# Patient Record
Sex: Male | Born: 1955 | Race: White | Hispanic: No | Marital: Married | Smoking: Never smoker
Health system: Southern US, Community
[De-identification: ages and names within clinical notes are randomized; demographics above are authoritative.]

## PROBLEM LIST (undated history)

## (undated) DIAGNOSIS — E119 Type 2 diabetes mellitus without complications: Secondary | ICD-10-CM

## (undated) DIAGNOSIS — I499 Cardiac arrhythmia, unspecified: Secondary | ICD-10-CM

---

## 2014-09-29 ENCOUNTER — Ambulatory Visit: Payer: Self-pay | Admitting: Ophthalmology

## 2014-11-03 ENCOUNTER — Ambulatory Visit: Payer: Self-pay | Admitting: Ophthalmology

## 2017-01-11 ENCOUNTER — Other Ambulatory Visit
Admission: RE | Admit: 2017-01-11 | Discharge: 2017-01-11 | Disposition: A | Discharge status: Home / Self Care | Payer: BLUE CROSS/BLUE SHIELD | Source: Ambulatory Visit | Attending: Ophthalmology | Admitting: Ophthalmology

## 2017-01-11 DIAGNOSIS — R51 Headache: Secondary | ICD-10-CM | POA: Diagnosis not present

## 2017-01-11 LAB — CBC WITH DIFFERENTIAL/PLATELET
BASOS ABS: 0 10*3/uL (ref 0–0.1)
Basophils Relative: 1 %
EOS PCT: 4 %
Eosinophils Absolute: 0.3 10*3/uL (ref 0–0.7)
HCT: 46 % (ref 40.0–52.0)
HEMOGLOBIN: 15.9 g/dL (ref 13.0–18.0)
LYMPHS ABS: 2.1 10*3/uL (ref 1.0–3.6)
LYMPHS PCT: 28 %
MCH: 31.2 pg (ref 26.0–34.0)
MCHC: 34.6 g/dL (ref 32.0–36.0)
MCV: 90.2 fL (ref 80.0–100.0)
Monocytes Absolute: 0.7 10*3/uL (ref 0.2–1.0)
Monocytes Relative: 10 %
NEUTROS PCT: 59 %
Neutro Abs: 4.4 10*3/uL (ref 1.4–6.5)
PLATELETS: 188 10*3/uL (ref 150–440)
RBC: 5.1 MIL/uL (ref 4.40–5.90)
RDW: 13.5 % (ref 11.5–14.5)
WBC: 7.5 10*3/uL (ref 3.8–10.6)

## 2017-01-11 LAB — C-REACTIVE PROTEIN: CRP: <0.8 mg/dL (ref <1.0)

## 2017-01-11 LAB — SEDIMENTATION RATE: SED RATE: 3 mm/h (ref 0–20)

## 2017-12-07 ENCOUNTER — Other Ambulatory Visit: Payer: Self-pay

## 2017-12-07 ENCOUNTER — Encounter: Payer: Self-pay | Admitting: Emergency Medicine

## 2017-12-07 ENCOUNTER — Emergency Department
Admission: EM | Admit: 2017-12-07 | Discharge: 2017-12-07 | Disposition: A | Discharge status: Home / Self Care | Payer: BLUE CROSS/BLUE SHIELD | Attending: Emergency Medicine | Admitting: Emergency Medicine

## 2017-12-07 ENCOUNTER — Emergency Department: Payer: BLUE CROSS/BLUE SHIELD

## 2017-12-07 DIAGNOSIS — R319 Hematuria, unspecified: Secondary | ICD-10-CM | POA: Diagnosis not present

## 2017-12-07 DIAGNOSIS — E119 Type 2 diabetes mellitus without complications: Secondary | ICD-10-CM | POA: Insufficient documentation

## 2017-12-07 DIAGNOSIS — N23 Unspecified renal colic: Secondary | ICD-10-CM | POA: Insufficient documentation

## 2017-12-07 DIAGNOSIS — R1032 Left lower quadrant pain: Secondary | ICD-10-CM | POA: Diagnosis present

## 2017-12-07 HISTORY — DX: Cardiac arrhythmia, unspecified: I49.9

## 2017-12-07 HISTORY — DX: Type 2 diabetes mellitus without complications: E11.9

## 2017-12-07 LAB — COMPREHENSIVE METABOLIC PANEL
ALT: 27 U/L (ref 17–63)
AST: 34 U/L (ref 15–41)
Albumin: 4.7 g/dL (ref 3.5–5.0)
Alkaline Phosphatase: 63 U/L (ref 38–126)
Anion gap: 12 (ref 5–15)
BUN: 36 mg/dL — AB (ref 6–20)
CO2: 25 mmol/L (ref 22–32)
Calcium: 9.7 mg/dL (ref 8.9–10.3)
Chloride: 92 mmol/L — ABNORMAL LOW (ref 101–111)
Creatinine, Ser: 1.77 mg/dL — ABNORMAL HIGH (ref 0.61–1.24)
GFR calc Af Amer: 45 mL/min — ABNORMAL LOW (ref >60)
GFR, EST NON AFRICAN AMERICAN: 39 mL/min — AB (ref >60)
Glucose, Bld: 248 mg/dL — ABNORMAL HIGH (ref 65–99)
POTASSIUM: 3.5 mmol/L (ref 3.5–5.1)
SODIUM: 129 mmol/L — AB (ref 135–145)
Total Bilirubin: 1.7 mg/dL — ABNORMAL HIGH (ref 0.3–1.2)
Total Protein: 8.4 g/dL — ABNORMAL HIGH (ref 6.5–8.1)

## 2017-12-07 LAB — URINALYSIS, COMPLETE (UACMP) WITH MICROSCOPIC
BACTERIA UA: NONE SEEN
BILIRUBIN URINE: NEGATIVE
Glucose, UA: >=500 mg/dL — AB
KETONES UR: NEGATIVE mg/dL
LEUKOCYTES UA: NEGATIVE
Nitrite: NEGATIVE
Protein, ur: 30 mg/dL — AB
Specific Gravity, Urine: 1.017 (ref 1.005–1.030)
pH: 5 (ref 5.0–8.0)

## 2017-12-07 LAB — CBC
HCT: 46.8 % (ref 40.0–52.0)
Hemoglobin: 16.2 g/dL (ref 13.0–18.0)
MCH: 30.7 pg (ref 26.0–34.0)
MCHC: 34.6 g/dL (ref 32.0–36.0)
MCV: 88.9 fL (ref 80.0–100.0)
Platelets: 199 10*3/uL (ref 150–440)
RBC: 5.27 MIL/uL (ref 4.40–5.90)
RDW: 13.5 % (ref 11.5–14.5)
WBC: 13.8 10*3/uL — AB (ref 3.8–10.6)

## 2017-12-07 LAB — LIPASE, BLOOD: LIPASE: 26 U/L (ref 11–51)

## 2017-12-07 MED ORDER — KETOROLAC TROMETHAMINE 30 MG/ML IJ SOLN
15.0000 mg | Freq: Once | INTRAMUSCULAR | Status: AC
  Administered 2017-12-07: 15 mg via INTRAVENOUS
  Filled 2017-12-07: qty 1

## 2017-12-07 MED ORDER — ONDANSETRON HCL 4 MG/2ML IJ SOLN
4.0000 mg | Freq: Once | INTRAMUSCULAR | Status: AC
  Administered 2017-12-07: 4 mg via INTRAVENOUS

## 2017-12-07 MED ORDER — FENTANYL CITRATE (PF) 100 MCG/2ML IJ SOLN
50.0000 ug | INTRAMUSCULAR | Status: DC | PRN
  Administered 2017-12-07: 50 ug via INTRAVENOUS

## 2017-12-07 MED ORDER — ONDANSETRON HCL 4 MG/2ML IJ SOLN
INTRAMUSCULAR | Status: AC
  Filled 2017-12-07: qty 2

## 2017-12-07 MED ORDER — ONDANSETRON 4 MG PO TBDP: 4.0000 mg | ORAL_TABLET | Freq: Three times a day (TID) | ORAL | 0 refills | Status: DC | PRN

## 2017-12-07 MED ORDER — FENTANYL CITRATE (PF) 100 MCG/2ML IJ SOLN
INTRAMUSCULAR | Status: AC
  Filled 2017-12-07: qty 2

## 2017-12-07 MED ORDER — HYDROCODONE-ACETAMINOPHEN 5-325 MG PO TABS: 1.0000 | ORAL_TABLET | Freq: Four times a day (QID) | ORAL | 0 refills | Status: DC | PRN

## 2017-12-07 MED ORDER — TAMSULOSIN HCL 0.4 MG PO CAPS: 0.4000 mg | ORAL_CAPSULE | Freq: Every day | ORAL | 0 refills | Status: DC

## 2017-12-07 NOTE — ED Triage Notes (Signed)
Arrives with c/o left lower back pain and lower abdominal pain and left flank pain x 1 day.  Also c/o N/V x 1 day.  C/O intermittent dysuria

## 2017-12-07 NOTE — ED Notes (Signed)
Pt ambulatory at discharge and denies having further questions. RN gave patient a urine strainer to collect stone.

## 2017-12-07 NOTE — Discharge Instructions (Signed)
Please take your pain medication as needed for severe symptoms and follow-up with the urologist this coming Tuesday for reevaluation.  The most dangerous thing that could possibly happen with a kidney stone as he could develop an infection.  If you develop a fever or chills at any point return immediately to the emergency department and do not wait for a follow-up appointment.  It was a pleasure to take care of you today, and thank you for coming to our emergency department.  If you have any questions or concerns before leaving please ask the nurse to grab me and I'm more than happy to go through your aftercare instructions again.  If you were prescribed any opioid pain medication today such as Norco, Vicodin, Percocet, morphine, hydrocodone, or oxycodone please make sure you do not drive when you are taking this medication as it can alter your ability to drive safely.  If you have any concerns once you are home that you are not improving or are in fact getting worse before you can make it to your follow-up appointment, please do not hesitate to call 911 and come back for further evaluation.  Darel Hong, MD  Results for orders placed or performed during the hospital encounter of 12/07/17  Lipase, blood  Result Value Ref Range   Lipase 26 11 - 51 U/L  Comprehensive metabolic panel  Result Value Ref Range   Sodium 129 (L) 135 - 145 mmol/L   Potassium 3.5 3.5 - 5.1 mmol/L   Chloride 92 (L) 101 - 111 mmol/L   CO2 25 22 - 32 mmol/L   Glucose, Bld 248 (H) 65 - 99 mg/dL   BUN 36 (H) 6 - 20 mg/dL   Creatinine, Ser 1.77 (H) 0.61 - 1.24 mg/dL   Calcium 9.7 8.9 - 10.3 mg/dL   Total Protein 8.4 (H) 6.5 - 8.1 g/dL   Albumin 4.7 3.5 - 5.0 g/dL   AST 34 15 - 41 U/L   ALT 27 17 - 63 U/L   Alkaline Phosphatase 63 38 - 126 U/L   Total Bilirubin 1.7 (H) 0.3 - 1.2 mg/dL   GFR calc non Af Amer 39 (L) >60 mL/min   GFR calc Af Amer 45 (L) >60 mL/min   Anion gap 12 5 - 15  CBC  Result Value Ref Range    WBC 13.8 (H) 3.8 - 10.6 K/uL   RBC 5.27 4.40 - 5.90 MIL/uL   Hemoglobin 16.2 13.0 - 18.0 g/dL   HCT 46.8 40.0 - 52.0 %   MCV 88.9 80.0 - 100.0 fL   MCH 30.7 26.0 - 34.0 pg   MCHC 34.6 32.0 - 36.0 g/dL   RDW 13.5 11.5 - 14.5 %   Platelets 199 150 - 440 K/uL  Urinalysis, Complete w Microscopic  Result Value Ref Range   Color, Urine YELLOW (A) YELLOW   APPearance CLEAR (A) CLEAR   Specific Gravity, Urine 1.017 1.005 - 1.030   pH 5.0 5.0 - 8.0   Glucose, UA >=500 (A) NEGATIVE mg/dL   Hgb urine dipstick LARGE (A) NEGATIVE   Bilirubin Urine NEGATIVE NEGATIVE   Ketones, ur NEGATIVE NEGATIVE mg/dL   Protein, ur 30 (A) NEGATIVE mg/dL   Nitrite NEGATIVE NEGATIVE   Leukocytes, UA NEGATIVE NEGATIVE   RBC / HPF TOO NUMEROUS TO COUNT 0 - 5 RBC/hpf   WBC, UA 0-5 0 - 5 WBC/hpf   Bacteria, UA NONE SEEN NONE SEEN   Squamous Epithelial / LPF 0-5 (A) NONE SEEN  Mucus PRESENT    Ct Renal Stone Study  Result Date: 12/07/2017 CLINICAL DATA:  HX kidney stones, C/o left flank pain since yesterday. Denies gross hematuria. No surg of a/p. EXAM: CT ABDOMEN AND PELVIS WITHOUT CONTRAST TECHNIQUE: Multidetector CT imaging of the abdomen and pelvis was performed following the standard protocol without IV contrast. COMPARISON:  None. FINDINGS: Lower chest: Clear lung bases.  Heart normal in size. Hepatobiliary: Several low-density liver lesions are noted, largest arising from the anterior margin of segment 3 measuring 18 mm, all consistent with cysts. No other liver lesions. 2 cm gallstone. No gallbladder wall thickening or adjacent inflammation. No bile duct dilation. Pancreas: Unremarkable. No pancreatic ductal dilatation or surrounding inflammatory changes. Spleen: Normal in size without focal abnormality. Adrenals/Urinary Tract: No adrenal masses. There is mild left hydronephrosis and moderate dilation of proximal left ureter with significant left perinephric and periureteral stranding. This is due to an 8 mm  stone in the proximal ureter. No other ureteral stones. No intrarenal stones. No renal masses. Right kidney and ureter are normal. Normal bladder. Stomach/Bowel: Stomach is within normal limits. Appendix appears normal. No evidence of bowel wall thickening, distention, or inflammatory changes. Vascular/Lymphatic: Aortic atherosclerosis. No enlarged abdominal or pelvic lymph nodes. Reproductive: Prostate mildly enlarged. Other: Small fat containing supraumbilical hernia. No bowel enters this. No ascites. Musculoskeletal: No fracture or acute finding. No osteoblastic or osteolytic lesions. IMPRESSION: 1. 8 mm stone in the proximal left ureter causes moderate obstructive uropathy. 2. No other acute findings.  No intrarenal stones. 3. Single gallstone.  No acute cholecystitis. 4. Small low-density liver lesions consistent with cysts. 5. Aortic atherosclerosis. Electronically Signed   By: Lajean Manes M.D.   On: 12/07/2017 09:52

## 2017-12-07 NOTE — ED Provider Notes (Signed)
[facility] Emergency Department Provider Note  ____________________________________________   First MD Initiated Contact with Patient 12/07/17 [phone]     (approximate)  I have reviewed the triage vital signs and the nursing notes.   HISTORY  Chief Complaint Abdominal Pain and Flank Pain   HPI Nathan Hansen. is a 61 y.o. male who self presents the emergency department with roughly 24 hours of severe left flank pain radiating towards his left groin associated with hematuria.  The pain began suddenly and has been intermittent ever since.  Nothing seems to make it better or worse.  He has had some nausea but no vomiting.  This feels identical to previous kidney stones.  He has passed 3-4 kidney stones in the past and has never required intervention.  Past Medical History:  Diagnosis Date  . Diabetes mellitus without complication ([place])   . Irregular heart beat     There are no active problems to display for this patient.   History reviewed. No pertinent surgical history.  Prior to Admission medications   Medication Sig Start Date End Date Taking? Authorizing Provider  HYDROcodone-acetaminophen (NORCO) 5-325 MG tablet Take 1 tablet by mouth every 6 (six) hours as needed for up to 21 doses for severe pain. 12/07/17   Darel Hong, MD  ondansetron (ZOFRAN ODT) 4 MG disintegrating tablet Take 1 tablet (4 mg total) by mouth every 8 (eight) hours as needed for nausea or vomiting. 12/07/17   Darel Hong, MD  tamsulosin (FLOMAX) 0.4 MG CAPS capsule Take 1 capsule (0.4 mg total) by mouth daily. 12/07/17   Darel Hong, MD    Allergies Patient has no known allergies.  No family history on file.  Social History Social History   Tobacco Use  . Smoking status: Never Smoker  . Smokeless tobacco: Never Used  Substance Use Topics  . Alcohol use: Not on file  . Drug use: Not on file    Review of Systems Constitutional: No fever/chills Eyes: No  visual changes. ENT: No sore throat. Cardiovascular: Denies chest pain. Respiratory: Denies shortness of breath. Gastrointestinal: Positive for abdominal pain.  Positive for nausea, no vomiting.  No diarrhea.  No constipation. Genitourinary: Negative for dysuria. Musculoskeletal: Positive for back pain. Skin: Negative for rash. Neurological: Negative for headaches, focal weakness or numbness.   ____________________________________________   PHYSICAL EXAM:  VITAL SIGNS: ED Triage Vitals  Enc Vitals Group     BP 12/07/17 0903 (!) 149/81     Pulse Rate 12/07/17 0903 64     Resp 12/07/17 0903 (!) 22     Temp 12/07/17 0903 97.7 F (36.5 C)     Temp Source 12/07/17 0903 Oral     SpO2 12/07/17 0903 100 %     Weight 12/07/17 0901 235 lb (106.6 kg)     Height 12/07/17 0901 6' (1.829 m)     Head Circumference --      Peak Flow --      Pain Score 12/07/17 0901 10     Pain Loc --      Pain Edu? --      Excl. in [place]? --     Constitutional: Alert and oriented x4 pacing around the room holding his left leg Eyes: PERRL EOMI. Head: Atraumatic. Nose: No congestion/rhinnorhea. Mouth/Throat: No trismus Neck: No stridor.   Cardiovascular: Normal rate, regular rhythm. Grossly normal heart sounds.  Good peripheral circulation. Respiratory: Normal respiratory effort.  No retractions. Lungs CTAB and moving good air  Gastrointestinal: Soft nondistended nontender no rebound or guarding no peritonitis no McBurney's tenderness negative Rovsing's no costovertebral tenderness Musculoskeletal: No lower extremity edema   Neurologic:  Normal speech and language. No gross focal neurologic deficits are appreciated. Skin:  Skin is warm, dry and intact. No rash noted. Psychiatric: Mood and affect are normal. Speech and behavior are normal.    ____________________________________________   DIFFERENTIAL includes but not limited to  Renal colic, AAA, pyelonephritis, infected  stone ____________________________________________   LABS (all labs ordered are listed, but only abnormal results are displayed)  Labs Reviewed  COMPREHENSIVE METABOLIC PANEL - Abnormal; Notable for the following components:      Result Value   Sodium 129 (*)    Chloride 92 (*)    Glucose, Bld 248 (*)    BUN 36 (*)    Creatinine, Ser 1.77 (*)    Total Protein 8.4 (*)    Total Bilirubin 1.7 (*)    GFR calc non Af Amer 39 (*)    GFR calc Af Amer 45 (*)    All other components within normal limits  CBC - Abnormal; Notable for the following components:   WBC 13.8 (*)    All other components within normal limits  URINALYSIS, COMPLETE (UACMP) WITH MICROSCOPIC - Abnormal; Notable for the following components:   Color, Urine YELLOW (*)    APPearance CLEAR (*)    Glucose, UA >=500 (*)    Hgb urine dipstick LARGE (*)    Protein, ur 30 (*)    Squamous Epithelial / LPF 0-5 (*)    All other components within normal limits  LIPASE, BLOOD    Blood work reviewed by me shows low GFR.  Urinalysis with blood but no evidence of.  Elevated white count is nonspecific and likely secondary to pain __________________________________________  EKG   ____________________________________________  RADIOLOGY  CT stone reviewed by me shows left-sided proximal 8 mm stone ____________________________________________   PROCEDURES  Procedure(s) performed: no  Procedures  Critical Care performed: no  Observation: no ____________________________________________   INITIAL IMPRESSION / ASSESSMENT AND PLAN / ED COURSE  Pertinent labs & imaging results that were available during my care of the patient were reviewed by me and considered in my medical decision making (see chart for details).  On arrival the patient is uncomfortable appearing with left flank pain and hematuria concerning for renal colic.  Abdominal exam is benign and no palpable masses.  CT is pending.      ----------------------------------------- 10:19 AM on 12/07/2017 -----------------------------------------  The patient CT scan confirms an 8 mm proximal left-sided stone with moderate obstructive hydronephrosis.  His GFR today is 39 and he reports no history of CKD although unfortunately we do not have any other in our system. ____________________________________________  I discussed the case with on-call urologist Dr. Pilar Jarvis who feels that the patient's renal function is adequate given his normal right kidney and that she the patient is suitable for outpatient management.  The patient is driving and does not have another ride home so unfortunately I am unable to provide him further opioid analgesia and will prescribe him Percocet for home.  Strict return precautions have been given and the patient verbalized understanding and agreement with the plan.  FINAL CLINICAL IMPRESSION(S) / ED DIAGNOSES  Final diagnoses:  Renal colic      NEW MEDICATIONS STARTED DURING THIS VISIT:  This SmartLink is deprecated. Use AVSMEDLIST instead to display the medication list for a patient.   Note:  This document  was prepared using Systems analyst and may include unintentional dictation errors.     Darel Hong, MD 12/08/17 [phone]

## 2017-12-11 ENCOUNTER — Encounter: Payer: Self-pay | Admitting: Emergency Medicine

## 2017-12-11 ENCOUNTER — Observation Stay: Admit: 2017-12-11 | Payer: Self-pay | Admitting: Urology

## 2017-12-11 ENCOUNTER — Observation Stay
Admission: EM | Admit: 2017-12-11 | Discharge: 2017-12-13 | Disposition: A | Discharge status: Home / Self Care | Payer: BLUE CROSS/BLUE SHIELD | Attending: Urology | Admitting: Urology

## 2017-12-11 ENCOUNTER — Ambulatory Visit (INDEPENDENT_AMBULATORY_CARE_PROVIDER_SITE_OTHER): Payer: BLUE CROSS/BLUE SHIELD | Admitting: Urology

## 2017-12-11 ENCOUNTER — Other Ambulatory Visit: Payer: Self-pay | Admitting: Radiology

## 2017-12-11 ENCOUNTER — Other Ambulatory Visit: Payer: Self-pay

## 2017-12-11 ENCOUNTER — Encounter: Payer: Self-pay | Admitting: Urology

## 2017-12-11 VITALS — BP 123/71 | HR 69 | Temp 98.0°F | Ht 72.0 in | Wt 239.2 lb

## 2017-12-11 DIAGNOSIS — I700 Atherosclerosis of aorta: Secondary | ICD-10-CM | POA: Diagnosis not present

## 2017-12-11 DIAGNOSIS — Z79899 Other long term (current) drug therapy: Secondary | ICD-10-CM | POA: Insufficient documentation

## 2017-12-11 DIAGNOSIS — E119 Type 2 diabetes mellitus without complications: Secondary | ICD-10-CM | POA: Diagnosis not present

## 2017-12-11 DIAGNOSIS — N201 Calculus of ureter: Secondary | ICD-10-CM

## 2017-12-11 DIAGNOSIS — Z6832 Body mass index (BMI) 32.0-32.9, adult: Secondary | ICD-10-CM | POA: Diagnosis not present

## 2017-12-11 DIAGNOSIS — Z7984 Long term (current) use of oral hypoglycemic drugs: Secondary | ICD-10-CM | POA: Diagnosis not present

## 2017-12-11 DIAGNOSIS — E669 Obesity, unspecified: Secondary | ICD-10-CM | POA: Insufficient documentation

## 2017-12-11 DIAGNOSIS — N132 Hydronephrosis with renal and ureteral calculous obstruction: Principal | ICD-10-CM | POA: Insufficient documentation

## 2017-12-11 DIAGNOSIS — I499 Cardiac arrhythmia, unspecified: Secondary | ICD-10-CM | POA: Insufficient documentation

## 2017-12-11 DIAGNOSIS — R109 Unspecified abdominal pain: Secondary | ICD-10-CM

## 2017-12-11 DIAGNOSIS — R3129 Other microscopic hematuria: Secondary | ICD-10-CM

## 2017-12-11 DIAGNOSIS — I10 Essential (primary) hypertension: Secondary | ICD-10-CM | POA: Insufficient documentation

## 2017-12-11 DIAGNOSIS — K769 Liver disease, unspecified: Secondary | ICD-10-CM | POA: Diagnosis not present

## 2017-12-11 LAB — COMPREHENSIVE METABOLIC PANEL
ALBUMIN: 4 g/dL (ref 3.5–5.0)
ALK PHOS: 62 U/L (ref 38–126)
ALT: 16 U/L — ABNORMAL LOW (ref 17–63)
ANION GAP: 13 (ref 5–15)
AST: 19 U/L (ref 15–41)
BUN: 32 mg/dL — AB (ref 6–20)
CO2: 23 mmol/L (ref 22–32)
Calcium: 9.4 mg/dL (ref 8.9–10.3)
Chloride: 94 mmol/L — ABNORMAL LOW (ref 101–111)
Creatinine, Ser: 1.53 mg/dL — ABNORMAL HIGH (ref 0.61–1.24)
GFR calc Af Amer: 54 mL/min — ABNORMAL LOW (ref >60)
GFR calc non Af Amer: 47 mL/min — ABNORMAL LOW (ref >60)
Glucose, Bld: 191 mg/dL — ABNORMAL HIGH (ref 65–99)
POTASSIUM: 3.9 mmol/L (ref 3.5–5.1)
SODIUM: 130 mmol/L — AB (ref 135–145)
Total Bilirubin: 1.8 mg/dL — ABNORMAL HIGH (ref 0.3–1.2)
Total Protein: 7.8 g/dL (ref 6.5–8.1)

## 2017-12-11 LAB — CBC
HCT: 43.1 % (ref 40.0–52.0)
HEMOGLOBIN: 14.9 g/dL (ref 13.0–18.0)
MCH: 30.6 pg (ref 26.0–34.0)
MCHC: 34.6 g/dL (ref 32.0–36.0)
MCV: 88.3 fL (ref 80.0–100.0)
Platelets: 210 10*3/uL (ref 150–440)
RBC: 4.88 MIL/uL (ref 4.40–5.90)
RDW: 13.5 % (ref 11.5–14.5)
WBC: 10.9 10*3/uL — AB (ref 3.8–10.6)

## 2017-12-11 LAB — APTT: aPTT: 24 seconds (ref 24–36)

## 2017-12-11 LAB — LIPASE, BLOOD: Lipase: 18 U/L (ref 11–51)

## 2017-12-11 LAB — SURGICAL PCR SCREEN
MRSA, PCR: NEGATIVE
Staphylococcus aureus: NEGATIVE

## 2017-12-11 LAB — PROTIME-INR
INR: 0.97
PROTHROMBIN TIME: 12.8 s (ref 11.4–15.2)

## 2017-12-11 MED ORDER — SODIUM CHLORIDE 0.9 % IV SOLN
INTRAVENOUS | Status: DC
  Administered 2017-12-11 – 2017-12-13 (×6): via INTRAVENOUS

## 2017-12-11 MED ORDER — DOCUSATE SODIUM 100 MG PO CAPS
100.0000 mg | ORAL_CAPSULE | Freq: Two times a day (BID) | ORAL | Status: DC
  Administered 2017-12-11: 100 mg via ORAL
  Filled 2017-12-11: qty 1

## 2017-12-11 MED ORDER — ONDANSETRON HCL 4 MG/2ML IJ SOLN
4.0000 mg | INTRAMUSCULAR | Status: DC | PRN
  Administered 2017-12-11 – 2017-12-12 (×6): 4 mg via INTRAVENOUS
  Filled 2017-12-11 (×6): qty 2

## 2017-12-11 MED ORDER — MORPHINE SULFATE (PF) 2 MG/ML IV SOLN
2.0000 mg | INTRAVENOUS | Status: DC | PRN
  Administered 2017-12-11: 4 mg via INTRAVENOUS
  Administered 2017-12-11 (×2): 2 mg via INTRAVENOUS
  Administered 2017-12-11: 4 mg via INTRAVENOUS
  Filled 2017-12-11: qty 1
  Filled 2017-12-11 (×3): qty 2
  Filled 2017-12-11: qty 1

## 2017-12-11 MED ORDER — OXYCODONE-ACETAMINOPHEN 10-325 MG PO TABS: 1.0000 | ORAL_TABLET | ORAL | 0 refills | Status: DC | PRN

## 2017-12-11 MED ORDER — DIPHENHYDRAMINE HCL 12.5 MG/5ML PO ELIX
12.5000 mg | ORAL_SOLUTION | Freq: Four times a day (QID) | ORAL | Status: DC | PRN
  Filled 2017-12-11: qty 5

## 2017-12-11 MED ORDER — CIPROFLOXACIN HCL 500 MG PO TABS: 500.0000 mg | ORAL_TABLET | ORAL | Status: DC

## 2017-12-11 MED ORDER — ONDANSETRON HCL 4 MG/2ML IJ SOLN
INTRAMUSCULAR | Status: AC
Start: 2017-12-11 — End: 2017-12-11
  Administered 2017-12-11: 4 mg via INTRAVENOUS
  Filled 2017-12-11: qty 2

## 2017-12-11 MED ORDER — OXYCODONE HCL 5 MG PO TABS
5.0000 mg | ORAL_TABLET | ORAL | Status: DC | PRN
  Administered 2017-12-12 (×3): 5 mg via ORAL
  Filled 2017-12-11 (×4): qty 1

## 2017-12-11 MED ORDER — HYDROMORPHONE HCL 1 MG/ML IJ SOLN
INTRAMUSCULAR | Status: AC
  Administered 2017-12-11: 1 mg via INTRAVENOUS
  Filled 2017-12-11: qty 1

## 2017-12-11 MED ORDER — HYDROMORPHONE HCL 1 MG/ML IJ SOLN
1.0000 mg | Freq: Once | INTRAMUSCULAR | Status: AC
  Administered 2017-12-11: 1 mg via INTRAVENOUS

## 2017-12-11 MED ORDER — DIPHENHYDRAMINE HCL 50 MG/ML IJ SOLN: 12.5000 mg | Freq: Four times a day (QID) | INTRAMUSCULAR | Status: DC | PRN

## 2017-12-11 MED ORDER — ACETAMINOPHEN 325 MG PO TABS: 650.0000 mg | ORAL_TABLET | ORAL | Status: DC | PRN

## 2017-12-11 MED ORDER — ONDANSETRON HCL 4 MG/2ML IJ SOLN
4.0000 mg | Freq: Once | INTRAMUSCULAR | Status: AC
  Administered 2017-12-11: 4 mg via INTRAVENOUS

## 2017-12-11 NOTE — H&P (View-Only) (Signed)
12/11/2017 12:04 PM   Nathan Hansen. 1956/04/09 [id number]  Referring provider: Danelle Berry, NP [address] [place], [place] [address]  Chief Complaint  Patient presents with  . Nephrolithiasis    HPI: Patient is a 61 year old Caucasian male with an 8 mm ureteral stone who is referred by [facility] ED.    He states the onset of the pain was one week ago.  It was sharp.  It lasted for four hours at a time.  The pain was located left flank and does not radiate.   The pain was a 10/10.  Pain medication has helped the pain until now.  Nothing made the pain worse.  He did not have gross hematuria.  He states he is going from hot to cold.  He has had nausea and vomiting.  The Zofran has controlled the nausea and vomiting.   He is having associated frequency, urgency and nocturia.    He does have a prior history of stones that he has passed spontaneously.  Stone composition is unknown.    In the ED on 12/07/2017, he received Flomax, Zofran, Toradol and Norco.  His UA was positive for TNTC RBC's.  His serum creatinine was 1.77.   His WBC count was 13.8.    CT Renal stone study performed on 12/07/2017 noted a 8 mm stone in the proximal left ureter causes moderate obstructive uropathy.  No other acute findings.  No intrarenal stones.  Single gallstone.  No acute cholecystitis.  Small low-density liver lesions consistent with cysts.  Aortic atherosclerosis..    At this time, he is very distraught due to the pain and becoming anxious.  He states he cannot take much more of this pain.   Reviewed referral notes.    - Nathan Hansen. is a 61 y.o. male who self presents the emergency department with roughly 24 hours of severe left flank pain radiating towards his left groin associated with hematuria.  The pain began suddenly and has been intermittent ever since.  Nothing seems to make it better or worse.  He has had some nausea but no vomiting.  This feels identical to previous kidney  stones.  He has passed 3-4 kidney stones in the past and has never required intervention.    PMH: Past Medical History:  Diagnosis Date  . Diabetes mellitus without complication ([place])   . Irregular heart beat     Surgical History: History reviewed. No pertinent surgical history.  Home Medications:  Allergies as of 12/11/2017   No Known Allergies     Medication List        Accurate as of 12/11/17 12:04 PM. Always use your most recent med list.          HYDROcodone-acetaminophen 5-325 MG tablet Commonly known as:  NORCO Take 1 tablet by mouth every 6 (six) hours as needed for up to 21 doses for severe pain.   losartan 25 MG tablet Commonly known as:  COZAAR Take 25 mg by mouth daily.   metFORMIN 1000 MG tablet Commonly known as:  GLUCOPHAGE Take 1,000 mg by mouth 2 (two) times daily.   metoprolol succinate 50 MG 24 hr tablet Commonly known as:  TOPROL-XL Take 50 mg by mouth daily.   ondansetron 4 MG disintegrating tablet Commonly known as:  ZOFRAN ODT Take 1 tablet (4 mg total) by mouth every 8 (eight) hours as needed for nausea or vomiting.   oxyCODONE-acetaminophen 10-325 MG tablet Commonly known  as:  PERCOCET Take 1 tablet by mouth every 4 (four) hours as needed for pain.   tamsulosin 0.4 MG Caps capsule Commonly known as:  FLOMAX Take 1 capsule (0.4 mg total) by mouth daily.       Allergies: No Known Allergies  Family History: Family History  Problem Relation Age of Onset  . Prostate cancer Neg Hx   . Bladder Cancer Neg Hx   . Kidney cancer Neg Hx     Social History:  reports that  has never smoked. he has never used smokeless tobacco. His alcohol and drug histories are not on file.  ROS: UROLOGY Frequent Urination?: Yes Hard to postpone urination?: Yes Burning/pain with urination?: No Get up at night to urinate?: Yes Leakage of urine?: No Urine stream starts and stops?: No Trouble starting stream?: No Do you have to strain to urinate?:  No Blood in urine?: No Urinary tract infection?: No Sexually transmitted disease?: No Injury to kidneys or bladder?: No Painful intercourse?: No Weak stream?: No Erection problems?: Yes Penile pain?: No  Gastrointestinal Nausea?: Yes Vomiting?: Yes Indigestion/heartburn?: No Diarrhea?: No Constipation?: No  Constitutional Fever: No Night sweats?: Yes Weight loss?: No Fatigue?: No  Skin Skin rash/lesions?: No Itching?: No  Eyes Blurred vision?: No Double vision?: No  Ears/Nose/Throat Sore throat?: No Sinus problems?: No  Hematologic/Lymphatic Swollen glands?: No Easy bruising?: No  Cardiovascular Leg swelling?: No Chest pain?: No  Respiratory Cough?: No Shortness of breath?: No  Endocrine Excessive thirst?: No  Musculoskeletal Back pain?: Yes Joint pain?: No  Neurological Headaches?: Yes Dizziness?: Yes  Psychologic Depression?: No Anxiety?: No  Physical Exam: BP 123/71 (BP Location: Right Arm, Patient Position: Sitting, Cuff Size: Large)   Pulse 69   Temp 98 F (36.7 C) (Oral)   Ht 6' (1.829 m)   Wt 239 lb 3.2 oz (108.5 kg)   BMI 32.44 kg/m   Constitutional: Well nourished. Alert and oriented, crying uncontrollably  HEENT: [place] AT, moist mucus membranes. Trachea midline, no masses. Cardiovascular: No clubbing, cyanosis, or edema. Respiratory: Normal respiratory effort, no increased work of breathing. GI: Abdomen is soft, non tender, non distended, no abdominal masses. Liver and spleen not palpable.  No hernias appreciated.  Stool sample for occult testing is not indicated.   GU: Left CVA tenderness.  No bladder fullness or masses.   Skin: No rashes, bruises or suspicious lesions. Lymph: No cervical or inguinal adenopathy. Neurologic: Grossly intact, no focal deficits, moving all 4 extremities. Psychiatric: Normal mood and affect.  Laboratory Data: Lab Results  Component Value Date   WBC 13.8 (H) 12/07/2017   HGB 16.2 12/07/2017    HCT 46.8 12/07/2017   MCV 88.9 12/07/2017   PLT 199 12/07/2017    Lab Results  Component Value Date   CREATININE 1.77 (H) 12/07/2017    Lab Results  Component Value Date   AST 34 12/07/2017   Lab Results  Component Value Date   ALT 27 12/07/2017    Urinalysis    Component Value Date/Time   COLORURINE YELLOW (A) 12/07/2017 0912   APPEARANCEUR CLEAR (A) 12/07/2017 0912   LABSPEC 1.017 12/07/2017 0912   PHURINE 5.0 12/07/2017 0912   GLUCOSEU >=500 (A) 12/07/2017 0912   HGBUR LARGE (A) 12/07/2017 0912   BILIRUBINUR NEGATIVE 12/07/2017 0912   KETONESUR NEGATIVE 12/07/2017 0912   PROTEINUR 30 (A) 12/07/2017 0912   NITRITE NEGATIVE 12/07/2017 0912   LEUKOCYTESUR NEGATIVE 12/07/2017 0912    I have reviewed the labs.  Pertinent Imaging: CLINICAL DATA:  HX kidney stones, C/o left flank pain since yesterday. Denies gross hematuria. No surg of a/p.  EXAM: CT ABDOMEN AND PELVIS WITHOUT CONTRAST  TECHNIQUE: Multidetector CT imaging of the abdomen and pelvis was performed following the standard protocol without IV contrast.  COMPARISON:  None.  FINDINGS: Lower chest: Clear lung bases.  Heart normal in size.  Hepatobiliary: Several low-density liver lesions are noted, largest arising from the anterior margin of segment 3 measuring 18 mm, all consistent with cysts. No other liver lesions. 2 cm gallstone. No gallbladder wall thickening or adjacent inflammation. No bile duct dilation.  Pancreas: Unremarkable. No pancreatic ductal dilatation or surrounding inflammatory changes.  Spleen: Normal in size without focal abnormality.  Adrenals/Urinary Tract: No adrenal masses.  There is mild left hydronephrosis and moderate dilation of proximal left ureter with significant left perinephric and periureteral stranding. This is due to an 8 mm stone in the proximal ureter. No other ureteral stones. No intrarenal stones. No renal masses. Right kidney and ureter are  normal. Normal bladder.  Stomach/Bowel: Stomach is within normal limits. Appendix appears normal. No evidence of bowel wall thickening, distention, or inflammatory changes.  Vascular/Lymphatic: Aortic atherosclerosis. No enlarged abdominal or pelvic lymph nodes.  Reproductive: Prostate mildly enlarged.  Other: Small fat containing supraumbilical hernia. No bowel enters this. No ascites.  Musculoskeletal: No fracture or acute finding. No osteoblastic or osteolytic lesions.  IMPRESSION: 1. 8 mm stone in the proximal left ureter causes moderate obstructive uropathy. 2. No other acute findings.  No intrarenal stones. 3. Single gallstone.  No acute cholecystitis. 4. Small low-density liver lesions consistent with cysts. 5. Aortic atherosclerosis.   Electronically Signed   By: Lajean Manes M.D.   On: 12/07/2017 09:52  I have independently reviewed the films.    Assessment & Plan:    1. Left ureteral stone  - 8 mm obstructing stone  - patient is crying uncontrollably in the office due to the pain  - we will schedule him for ESWL in the morning   - I explained that ESWL is a means of pulverizing urinary stones without surgery using shockwave therapy  - I discussed the risks involved with ESWL consist of bruising to the skin and kidney region as a result of the shockwave, possibility of long-term kidney damage, development of high blood pressure and damage to the bowel or long, hematuria, urinary bleeding serious enough to require transfusion or surgical repair or removal of the kidney is rare, rare chance of hematoma formation in the kidney and injuries to the spleen, liver or pancreas.  There is also the risk of urinary tract infection or any infection of the blood system or tissue.   There is also a possibility of resultant damage to male organs.  - I explained that sometimes the stone fragments can stack up in the ureter like coins, a phenomenon described as  "Steinstrasse", that would result in a stent placement and/or URS for further treatment  - I informed the patient that IV sedation is typically used on the truck, but it some rare instances we need to use general anesthesia - the risks being infection, irregular heart beat, irregular BP, stroke, MI, CVA, paralysis, coma and/or death.   2. Left flank pain  - due to obstructing stone   - admitted overnight for pain control in preparation for ESWL    3. Microscopic hematuria  - We will continue to monitor the patient's UA after the treatment/passage of the  stone to ensure the hematuria has resolved.  If hematuria persists, we will pursue a hematuria workup with CT Urogram and cystoscopy if appropriate.   Return for admiited for pain control.  These notes generated with voice recognition software. I apologize for typographical errors.  [name], [facility] [address] [place], [place] [address] [phone]

## 2017-12-11 NOTE — ED Triage Notes (Signed)
Pt sent over for further eval of 36mm kidney stone on the left side since Saturday, unable to pass.

## 2017-12-11 NOTE — ED Provider Notes (Signed)
[facility] Emergency Department Provider Note   ____________________________________________    I have reviewed the triage vital signs and the nursing notes.   HISTORY  Chief Complaint Flank Pain and Nephrolithiasis     HPI Nathan Hansen. is a 61 y.o. male who presents with severe left flank pain.  Sent by urologist.  Patient with known 8 mm left ureteral stone.  He complains of sharp pain radiating from his left flank into his groin.  Nothing makes it worse.  Nothing seems to help.  Denies fevers or chills.  Discussed with [name] who states Dr. Erlene Quan will admit the patient.  Review of medical records demonstrates that he was seen here on the eighth and diagnosed with a stone  Past Medical History:  Diagnosis Date  . Diabetes mellitus without complication ([place])   . Irregular heart beat     There are no active problems to display for this patient.   History reviewed. No pertinent surgical history.  Prior to Admission medications   Medication Sig Start Date End Date Taking? Authorizing Provider  HYDROcodone-acetaminophen (NORCO) 5-325 MG tablet Take 1 tablet by mouth every 6 (six) hours as needed for up to 21 doses for severe pain. 12/07/17   Darel Hong, MD  losartan (COZAAR) 25 MG tablet Take 25 mg by mouth daily. 11/14/17   [provider]  metFORMIN (GLUCOPHAGE) 1000 MG tablet Take 1,000 mg by mouth 2 (two) times daily. 11/14/17   [provider]  metoprolol succinate (TOPROL-XL) 50 MG 24 hr tablet Take 50 mg by mouth daily. 11/14/17   [provider]  ondansetron (ZOFRAN ODT) 4 MG disintegrating tablet Take 1 tablet (4 mg total) by mouth every 8 (eight) hours as needed for nausea or vomiting. 12/07/17   Darel Hong, MD  oxyCODONE-acetaminophen (PERCOCET) 10-325 MG tablet Take 1 tablet by mouth every 4 (four) hours as needed for pain. 12/11/17   [name] A, PA-C  tamsulosin (FLOMAX) 0.4 MG  CAPS capsule Take 1 capsule (0.4 mg total) by mouth daily. 12/07/17   Darel Hong, MD     Allergies Patient has no known allergies.  Family History  Problem Relation Age of Onset  . Prostate cancer Neg Hx   . Bladder Cancer Neg Hx   . Kidney cancer Neg Hx     Social History Social History   Tobacco Use  . Smoking status: Never Smoker  . Smokeless tobacco: Never Used  Substance Use Topics  . Alcohol use: Not on file  . Drug use: Not on file    Review of Systems  Constitutional: No fever/chills Eyes: No visual changes.  ENT: No sore throat. Cardiovascular: Denies chest pain. Respiratory: Denies shortness of breath. Gastrointestinal: As above Genitourinary: Positive hematuria Musculoskeletal: Negative for back pain. Skin: Negative for rash. Neurological: Negative for headaches    ____________________________________________   PHYSICAL EXAM:  VITAL SIGNS: ED Triage Vitals [12/11/17 1143]  Enc Vitals Group     BP (!) 162/114     Pulse Rate 75     Resp (!) 22     Temp 97.8 F (36.6 C)     Temp Source Oral     SpO2 99 %     Weight      Height      Head Circumference      Peak Flow      Pain Score 10     Pain Loc      Pain Edu?  Excl. in [place]?     Constitutional: Alert and oriented.  Moaning Eyes: Conjunctivae are normal.  . Nose: No congestion/rhinnorhea. Mouth/Throat: Mucous membranes are moist.    Cardiovascular: Normal rate, regular rhythm.   Good peripheral circulation. Respiratory: Normal respiratory effort.  No retractions.  Gastrointestinal: Soft and nontender. No distention.  No CVA tenderness. Genitourinary: deferred Musculoskeletal:   Warm and well perfused Neurologic:  Normal speech and language. No gross focal neurologic deficits are appreciated.  Skin:  Skin is warm, dry and intact. No rash noted. Psychiatric: Mood and affect are normal. Speech and behavior are normal.  ____________________________________________   LABS (all  labs ordered are listed, but only abnormal results are displayed)  Labs Reviewed  CBC - Abnormal; Notable for the following components:      Result Value   WBC 10.9 (*)    All other components within normal limits  COMPREHENSIVE METABOLIC PANEL  LIPASE, BLOOD  APTT  PROTIME-INR  URINALYSIS, COMPLETE (UACMP) WITH MICROSCOPIC   ____________________________________________  EKG  None ____________________________________________  RADIOLOGY  None ____________________________________________   PROCEDURES  Procedure(s) performed: No  Procedures   Critical Care performed:No ____________________________________________   INITIAL IMPRESSION / ASSESSMENT AND PLAN / ED COURSE  Pertinent labs & imaging results that were available during my care of the patient were reviewed by me and considered in my medical decision making (see chart for details).  Patient presents with severe left flank pain, almost certainly caused by ureterolithiasis.  Given IV Dilaudid and IV Zofran with good effect  Dr. Erlene Quan to admit  ----------------------------------------- 12:22 PM on 12/11/2017 -----------------------------------------  Notified that patient had brief desaturation, placed on nasal cannula oxygen as a precaution this may be related to IV Dilaudid administration    ____________________________________________   FINAL CLINICAL IMPRESSION(S) / ED DIAGNOSES  Final diagnoses:  Ureterolithiasis        Note:  This document was prepared using Dragon voice recognition software and may include unintentional dictation errors.    Lavonia Drafts, MD 12/11/17 [phone]

## 2017-12-11 NOTE — H&P (Signed)
I have seen and examined the patient, labs/ imaging reviewed and discussed  management with Nathan Hansen.  I reviewed the PA's note and agree with the documented findings and plan of care.  Patient was sent from clinic to the emergency room for very poorly controlled pain.  He will be admitted from the emergency room for IV pain medications awaiting shockwave lithotripsy tomorrow as below.  All questions answered.  N.p.o. at midnight.        12/11/2017 12:04 PM   Nathan Dom. 1956-02-19 [id number]  Referring provider: Danelle Berry, NP [address] [place], [place] [address]     Chief Complaint  Patient presents with  . Nephrolithiasis    HPI: Patient is a 61 year old Caucasian male with an 8 mm ureteral stone who is referred by [facility] ED.    He states the onset of the pain was one week ago.  It was sharp.  It lasted for four hours at a time.  The pain was located left flank and does not radiate.   The pain was a 10/10.  Pain medication has helped the pain until now.  Nothing made the pain worse.  He did not have gross hematuria.  He states he is going from hot to cold.  He has had nausea and vomiting.  The Zofran has controlled the nausea and vomiting.   He is having associated frequency, urgency and nocturia.    He does have a prior history of stones that he has passed spontaneously.  Stone composition is unknown.    In the ED on 12/07/2017, he received Flomax, Zofran, Toradol and Norco.  His UA was positive for TNTC RBC's.  His serum creatinine was 1.77.   His WBC count was 13.8.    CT Renal stone study performed on 12/07/2017 noted a 8 mm stone in the proximal left ureter causes moderate obstructive uropathy.  No other acute findings. No intrarenal stones.  Single gallstone. No acute cholecystitis.  Small low-density liver lesions consistent with cysts.  Aortic atherosclerosis..    At this time, he is very distraught due to the pain and becoming  anxious.  He states he cannot take much more of this pain.   Reviewed referral notes.               - Nathan Hansenis a 61 y.o.malewho self presents the emergency department with roughly 24 hours of severe left flank pain radiating towards his left groin associated with hematuria. The pain began suddenly and has been intermittent ever since. Nothing seems to make it better or worse. He has had some nausea but no vomiting. This feels identical to previous kidney stones. He has passed 3-4 kidney stones in the past and has never required intervention.    PMH:     Past Medical History:  Diagnosis Date  . Diabetes mellitus without complication (Nathan Hansen)   . Irregular heart beat     Surgical History: History reviewed. No pertinent surgical history.  Home Medications:  Allergies as of 12/11/2017   No Known Allergies                 Medication List             Accurate as of 12/11/17 12:04 PM. Always use your most recent med list.           HYDROcodone-acetaminophen 5-325 MG tablet Commonly known as:  NORCO Take 1 tablet by mouth every 6 (six) hours  as needed for up to 21 doses for severe pain.   losartan 25 MG tablet Commonly known as:  COZAAR Take 25 mg by mouth daily.   metFORMIN 1000 MG tablet Commonly known as:  GLUCOPHAGE Take 1,000 mg by mouth 2 (two) times daily.   metoprolol succinate 50 MG 24 hr tablet Commonly known as:  TOPROL-XL Take 50 mg by mouth daily.   ondansetron 4 MG disintegrating tablet Commonly known as:  ZOFRAN ODT Take 1 tablet (4 mg total) by mouth every 8 (eight) hours as needed for nausea or vomiting.   oxyCODONE-acetaminophen 10-325 MG tablet Commonly known as:  PERCOCET Take 1 tablet by mouth every 4 (four) hours as needed for pain.   tamsulosin 0.4 MG Caps capsule Commonly known as:  FLOMAX Take 1 capsule (0.4 mg total) by mouth daily.       Allergies: No Known Allergies  Family  History: Family History  Problem Relation Age of Onset  . Prostate cancer Neg Hx   . Bladder Cancer Neg Hx   . Kidney cancer Neg Hx     Social History:  reports that  has never smoked. he has never used smokeless tobacco. His alcohol and drug histories are not on file.  ROS: UROLOGY Frequent Urination?: Yes Hard to postpone urination?: Yes Burning/pain with urination?: No Get up at night to urinate?: Yes Leakage of urine?: No Urine stream starts and stops?: No Trouble starting stream?: No Do you have to strain to urinate?: No Blood in urine?: No Urinary tract infection?: No Sexually transmitted disease?: No Injury to kidneys or bladder?: No Painful intercourse?: No Weak stream?: No Erection problems?: Yes Penile pain?: No  Gastrointestinal Nausea?: Yes Vomiting?: Yes Indigestion/heartburn?: No Diarrhea?: No Constipation?: No  Constitutional Fever: No Night sweats?: Yes Weight loss?: No Fatigue?: No  Skin Skin rash/lesions?: No Itching?: No  Eyes Blurred vision?: No Double vision?: No  Ears/Nose/Throat Sore throat?: No Sinus problems?: No  Hematologic/Lymphatic Swollen glands?: No Easy bruising?: No  Cardiovascular Leg swelling?: No Chest pain?: No  Respiratory Cough?: No Shortness of breath?: No  Endocrine Excessive thirst?: No  Musculoskeletal Back pain?: Yes Joint pain?: No  Neurological Headaches?: Yes Dizziness?: Yes  Psychologic Depression?: No Anxiety?: No  Physical Exam: BP 123/71 (BP Location: Right Arm, Patient Position: Sitting, Cuff Size: Large)   Pulse 69   Temp 98 F (36.7 C) (Oral)   Ht 6' (1.829 m)   Wt 239 lb 3.2 oz (108.5 kg)   BMI 32.44 kg/m   Constitutional: Well nourished. Alert and oriented, crying uncontrollably  HEENT: [place] AT, moist mucus membranes. Trachea midline, no masses. Cardiovascular: No clubbing, cyanosis, or edema. Respiratory: Normal respiratory effort, no increased work  of breathing. GI: Abdomen is soft, non tender, non distended, no abdominal masses. Liver and spleen not palpable.  No hernias appreciated.  Stool sample for occult testing is not indicated.   GU: Left CVA tenderness.  No bladder fullness or masses.   Skin: No rashes, bruises or suspicious lesions. Lymph: No cervical or inguinal adenopathy. Neurologic: Grossly intact, no focal deficits, moving all 4 extremities. Psychiatric: Normal mood and affect.  Laboratory Data: RecentLabs       Lab Results  Component Value Date   WBC 13.8 (H) 12/07/2017   HGB 16.2 12/07/2017   HCT 46.8 12/07/2017   MCV 88.9 12/07/2017   PLT 199 12/07/2017      RecentLabs       Lab Results  Component Value Date  CREATININE 1.77 (H) 12/07/2017      RecentLabs       Lab Results  Component Value Date   AST 34 12/07/2017     RecentLabs       Lab Results  Component Value Date   ALT 27 12/07/2017      Urinalysis Labs(Brief)          Component Value Date/Time   COLORURINE YELLOW (A) 12/07/2017 0912   APPEARANCEUR CLEAR (A) 12/07/2017 0912   LABSPEC 1.017 12/07/2017 0912   PHURINE 5.0 12/07/2017 0912   GLUCOSEU >=500 (A) 12/07/2017 0912   HGBUR LARGE (A) 12/07/2017 0912   BILIRUBINUR NEGATIVE 12/07/2017 0912   KETONESUR NEGATIVE 12/07/2017 0912   PROTEINUR 30 (A) 12/07/2017 0912   NITRITE NEGATIVE 12/07/2017 0912   LEUKOCYTESUR NEGATIVE 12/07/2017 0912      I have reviewed the labs.   Pertinent Imaging: CLINICAL DATA: HX kidney stones, C/o left flank pain since yesterday. Denies gross hematuria. No surg of a/p.  EXAM: CT ABDOMEN AND PELVIS WITHOUT CONTRAST  TECHNIQUE: Multidetector CT imaging of the abdomen and pelvis was performed following the standard protocol without IV contrast.  COMPARISON: None.  FINDINGS: Lower chest: Clear lung bases. Heart normal in size.  Hepatobiliary: Several low-density liver lesions are noted,  largest arising from the anterior margin of segment 3 measuring 18 mm, all consistent with cysts. No other liver lesions. 2 cm gallstone. No gallbladder wall thickening or adjacent inflammation. No bile duct dilation.  Pancreas: Unremarkable. No pancreatic ductal dilatation or surrounding inflammatory changes.  Spleen: Normal in size without focal abnormality.  Adrenals/Urinary Tract: No adrenal masses.  There is mild left hydronephrosis and moderate dilation of proximal left ureter with significant left perinephric and periureteral stranding. This is due to an 8 mm stone in the proximal ureter. No other ureteral stones. No intrarenal stones. No renal masses. Right kidney and ureter are normal. Normal bladder.  Stomach/Bowel: Stomach is within normal limits. Appendix appears normal. No evidence of bowel wall thickening, distention, or inflammatory changes.  Vascular/Lymphatic: Aortic atherosclerosis. No enlarged abdominal or pelvic lymph nodes.  Reproductive: Prostate mildly enlarged.  Other: Small fat containing supraumbilical hernia. No bowel enters this. No ascites.  Musculoskeletal: No fracture or acute finding. No osteoblastic or osteolytic lesions.  IMPRESSION: 1. 8 mm stone in the proximal left ureter causes moderate obstructive uropathy. 2. No other acute findings. No intrarenal stones. 3. Single gallstone. No acute cholecystitis. 4. Small low-density liver lesions consistent with cysts. 5. Aortic atherosclerosis.   Electronically Signed By: Lajean Manes M.D. On: 12/07/2017 09:52  I have independently reviewed the films.    Assessment & Plan:    1. Left ureteral stone             - 8 mm obstructing stone             - patient is crying uncontrollably in the office due to the pain             - we will schedule him for ESWL in the morning              - I explained that ESWL is a means of pulverizing urinary stones without surgery  using shockwave therapy             - I discussed the risks involved with ESWL consist of bruising to the skin and kidney region as a result of the shockwave, possibility of long-term kidney damage, development of high blood  pressure and damage to the bowel or long, hematuria, urinary bleeding serious enough to require transfusion or surgical repair or removal of the kidney is rare, rare chance of hematoma formation in the kidney and injuries to the spleen, liver or pancreas.  There is also the risk of urinary tract infection or any infection of the blood system or tissue.   There is also a possibility of resultant damage to male organs.             - I explained that sometimes the stone fragments can stack up in the ureter like coins, a phenomenon described as "Steinstrasse", that would result in a stent placement and/or URS for further treatment             - I informed the patient that IV sedation is typically used on the truck, but it some rare instances we need to use general anesthesia - the risks being infection, irregular heart beat, irregular BP, stroke, MI, CVA, paralysis, coma and/or death.              2. Left flank pain             - due to obstructing stone              - admitted overnight for pain control in preparation for ESWL    3. Microscopic hematuria             - We will continue to monitor the patient's UA after the treatment/passage of the stone to ensure the hematuria has resolved.  If hematuria persists, we will pursue a hematuria workup with CT Urogram and cystoscopy if appropriate.   Return for admiited for pain control.  These notes generated with voice recognition software. I apologize for typographical errors.  Nathan Council, PA-C  [facility] [place], [place] [address] [phone]

## 2017-12-11 NOTE — ED Notes (Signed)
Pt given sandwich tray and coke  

## 2017-12-11 NOTE — ED Notes (Signed)
Pt desat to 76% with good wave form - deep breaths in through nose and out through mouth only increased to 82% - placed pt on 2L o2 via n/c and with deep breathing increased to 97% - Dr Corky Downs notified

## 2017-12-11 NOTE — Progress Notes (Signed)
12/11/2017 12:04 PM   [name]. [birth year] [id number]  Referring provider: Danelle Berry, NP [address] [place], [place] [address]  Chief Complaint  Patient presents with  . Nephrolithiasis    HPI: Patient is a 61 year old Caucasian male with an 8 mm ureteral stone who is referred by [facility] ED.    He states the onset of the pain was one week ago.  It was sharp.  It lasted for four hours at a time.  The pain was located left flank and does not radiate.   The pain was a 10/10.  Pain medication has helped the pain until now.  Nothing made the pain worse.  He did not have gross hematuria.  He states he is going from hot to cold.  He has had nausea and vomiting.  The Zofran has controlled the nausea and vomiting.   He is having associated frequency, urgency and nocturia.    He does have a prior history of stones that he has passed spontaneously.  Stone composition is unknown.    In the ED on 12/07/2017, he received Flomax, Zofran, Toradol and Norco.  His UA was positive for TNTC RBC's.  His serum creatinine was 1.77.   His WBC count was 13.8.    CT Renal stone study performed on 12/07/2017 noted a 8 mm stone in the proximal left ureter causes moderate obstructive uropathy.  No other acute findings.  No intrarenal stones.  Single gallstone.  No acute cholecystitis.  Small low-density liver lesions consistent with cysts.  Aortic atherosclerosis..    At this time, he is very distraught due to the pain and becoming anxious.  He states he cannot take much more of this pain.   Reviewed referral notes.    - Hiroshi Krummel. is a 61 y.o. male who self presents the emergency department with roughly 24 hours of severe left flank pain radiating towards his left groin associated with hematuria.  The pain began suddenly and has been intermittent ever since.  Nothing seems to make it better or worse.  He has had some nausea but no vomiting.  This feels identical to previous kidney  stones.  He has passed 3-4 kidney stones in the past and has never required intervention.    PMH: Past Medical History:  Diagnosis Date  . Diabetes mellitus without complication ([place])   . Irregular heart beat     Surgical History: History reviewed. No pertinent surgical history.  Home Medications:  Allergies as of 12/11/2017   No Known Allergies     Medication List        Accurate as of 12/11/17 12:04 PM. Always use your most recent med list.          HYDROcodone-acetaminophen 5-325 MG tablet Commonly known as:  NORCO Take 1 tablet by mouth every 6 (six) hours as needed for up to 21 doses for severe pain.   losartan 25 MG tablet Commonly known as:  COZAAR Take 25 mg by mouth daily.   metFORMIN 1000 MG tablet Commonly known as:  GLUCOPHAGE Take 1,000 mg by mouth 2 (two) times daily.   metoprolol succinate 50 MG 24 hr tablet Commonly known as:  TOPROL-XL Take 50 mg by mouth daily.   ondansetron 4 MG disintegrating tablet Commonly known as:  ZOFRAN ODT Take 1 tablet (4 mg total) by mouth every 8 (eight) hours as needed for nausea or vomiting.   oxyCODONE-acetaminophen 10-325 MG tablet Commonly known  as:  PERCOCET Take 1 tablet by mouth every 4 (four) hours as needed for pain.   tamsulosin 0.4 MG Caps capsule Commonly known as:  FLOMAX Take 1 capsule (0.4 mg total) by mouth daily.       Allergies: No Known Allergies  Family History: Family History  Problem Relation Age of Onset  . Prostate cancer Neg Hx   . Bladder Cancer Neg Hx   . Kidney cancer Neg Hx     Social History:  reports that  has never smoked. he has never used smokeless tobacco. His alcohol and drug histories are not on file.  ROS: UROLOGY Frequent Urination?: Yes Hard to postpone urination?: Yes Burning/pain with urination?: No Get up at night to urinate?: Yes Leakage of urine?: No Urine stream starts and stops?: No Trouble starting stream?: No Do you have to strain to urinate?:  No Blood in urine?: No Urinary tract infection?: No Sexually transmitted disease?: No Injury to kidneys or bladder?: No Painful intercourse?: No Weak stream?: No Erection problems?: Yes Penile pain?: No  Gastrointestinal Nausea?: Yes Vomiting?: Yes Indigestion/heartburn?: No Diarrhea?: No Constipation?: No  Constitutional Fever: No Night sweats?: Yes Weight loss?: No Fatigue?: No  Skin Skin rash/lesions?: No Itching?: No  Eyes Blurred vision?: No Double vision?: No  Ears/Nose/Throat Sore throat?: No Sinus problems?: No  Hematologic/Lymphatic Swollen glands?: No Easy bruising?: No  Cardiovascular Leg swelling?: No Chest pain?: No  Respiratory Cough?: No Shortness of breath?: No  Endocrine Excessive thirst?: No  Musculoskeletal Back pain?: Yes Joint pain?: No  Neurological Headaches?: Yes Dizziness?: Yes  Psychologic Depression?: No Anxiety?: No  Physical Exam: BP 123/71 (BP Location: Right Arm, Patient Position: Sitting, Cuff Size: Large)   Pulse 69   Temp 98 F (36.7 C) (Oral)   Ht 6' (1.829 m)   Wt 239 lb 3.2 oz (108.5 kg)   BMI 32.44 kg/m   Constitutional: Well nourished. Alert and oriented, crying uncontrollably  HEENT: [place] AT, moist mucus membranes. Trachea midline, no masses. Cardiovascular: No clubbing, cyanosis, or edema. Respiratory: Normal respiratory effort, no increased work of breathing. GI: Abdomen is soft, non tender, non distended, no abdominal masses. Liver and spleen not palpable.  No hernias appreciated.  Stool sample for occult testing is not indicated.   GU: Left CVA tenderness.  No bladder fullness or masses.   Skin: No rashes, bruises or suspicious lesions. Lymph: No cervical or inguinal adenopathy. Neurologic: Grossly intact, no focal deficits, moving all 4 extremities. Psychiatric: Normal mood and affect.  Laboratory Data: Lab Results  Component Value Date   WBC 13.8 (H) 12/07/2017   HGB 16.2 12/07/2017    HCT 46.8 12/07/2017   MCV 88.9 12/07/2017   PLT 199 12/07/2017    Lab Results  Component Value Date   CREATININE 1.77 (H) 12/07/2017    Lab Results  Component Value Date   AST 34 12/07/2017   Lab Results  Component Value Date   ALT 27 12/07/2017    Urinalysis    Component Value Date/Time   COLORURINE YELLOW (A) 12/07/2017 0912   APPEARANCEUR CLEAR (A) 12/07/2017 0912   LABSPEC 1.017 12/07/2017 0912   PHURINE 5.0 12/07/2017 0912   GLUCOSEU >=500 (A) 12/07/2017 0912   HGBUR LARGE (A) 12/07/2017 0912   BILIRUBINUR NEGATIVE 12/07/2017 0912   KETONESUR NEGATIVE 12/07/2017 0912   PROTEINUR 30 (A) 12/07/2017 0912   NITRITE NEGATIVE 12/07/2017 0912   LEUKOCYTESUR NEGATIVE 12/07/2017 0912    I have reviewed the labs.  Pertinent Imaging: CLINICAL DATA:  HX kidney stones, C/o left flank pain since yesterday. Denies gross hematuria. No surg of a/p.  EXAM: CT ABDOMEN AND PELVIS WITHOUT CONTRAST  TECHNIQUE: Multidetector CT imaging of the abdomen and pelvis was performed following the standard protocol without IV contrast.  COMPARISON:  None.  FINDINGS: Lower chest: Clear lung bases.  Heart normal in size.  Hepatobiliary: Several low-density liver lesions are noted, largest arising from the anterior margin of segment 3 measuring 18 mm, all consistent with cysts. No other liver lesions. 2 cm gallstone. No gallbladder wall thickening or adjacent inflammation. No bile duct dilation.  Pancreas: Unremarkable. No pancreatic ductal dilatation or surrounding inflammatory changes.  Spleen: Normal in size without focal abnormality.  Adrenals/Urinary Tract: No adrenal masses.  There is mild left hydronephrosis and moderate dilation of proximal left ureter with significant left perinephric and periureteral stranding. This is due to an 8 mm stone in the proximal ureter. No other ureteral stones. No intrarenal stones. No renal masses. Right kidney and ureter are  normal. Normal bladder.  Stomach/Bowel: Stomach is within normal limits. Appendix appears normal. No evidence of bowel wall thickening, distention, or inflammatory changes.  Vascular/Lymphatic: Aortic atherosclerosis. No enlarged abdominal or pelvic lymph nodes.  Reproductive: Prostate mildly enlarged.  Other: Small fat containing supraumbilical hernia. No bowel enters this. No ascites.  Musculoskeletal: No fracture or acute finding. No osteoblastic or osteolytic lesions.  IMPRESSION: 1. 8 mm stone in the proximal left ureter causes moderate obstructive uropathy. 2. No other acute findings.  No intrarenal stones. 3. Single gallstone.  No acute cholecystitis. 4. Small low-density liver lesions consistent with cysts. 5. Aortic atherosclerosis.   Electronically Signed   By: Lajean Manes M.D.   On: 12/07/2017 09:52  I have independently reviewed the films.    Assessment & Plan:    1. Left ureteral stone  - 8 mm obstructing stone  - patient is crying uncontrollably in the office due to the pain  - we will schedule him for ESWL in the morning   - I explained that ESWL is a means of pulverizing urinary stones without surgery using shockwave therapy  - I discussed the risks involved with ESWL consist of bruising to the skin and kidney region as a result of the shockwave, possibility of long-term kidney damage, development of high blood pressure and damage to the bowel or long, hematuria, urinary bleeding serious enough to require transfusion or surgical repair or removal of the kidney is rare, rare chance of hematoma formation in the kidney and injuries to the spleen, liver or pancreas.  There is also the risk of urinary tract infection or any infection of the blood system or tissue.   There is also a possibility of resultant damage to male organs.  - I explained that sometimes the stone fragments can stack up in the ureter like coins, a phenomenon described as  "Steinstrasse", that would result in a stent placement and/or URS for further treatment  - I informed the patient that IV sedation is typically used on the truck, but it some rare instances we need to use general anesthesia - the risks being infection, irregular heart beat, irregular BP, stroke, MI, CVA, paralysis, coma and/or death.   2. Left flank pain  - due to obstructing stone   - admitted overnight for pain control in preparation for ESWL    3. Microscopic hematuria  - We will continue to monitor the patient's UA after the treatment/passage of the  stone to ensure the hematuria has resolved.  If hematuria persists, we will pursue a hematuria workup with CT Urogram and cystoscopy if appropriate.   Return for admiited for pain control.  These notes generated with voice recognition software. I apologize for typographical errors.  [name], [facility] [address], [place], [place] [address] [phone]

## 2017-12-12 ENCOUNTER — Observation Stay: Payer: BLUE CROSS/BLUE SHIELD

## 2017-12-12 ENCOUNTER — Other Ambulatory Visit: Payer: Self-pay

## 2017-12-12 ENCOUNTER — Encounter
Admission: EM | Disposition: A | Discharge status: Home / Self Care | Payer: Self-pay | Source: Home / Self Care | Attending: Emergency Medicine

## 2017-12-12 HISTORY — PX: EXTRACORPOREAL SHOCK WAVE LITHOTRIPSY: SHX1557

## 2017-12-12 LAB — GLUCOSE, CAPILLARY: GLUCOSE-CAPILLARY: 151 mg/dL — AB (ref 65–99)

## 2017-12-12 SURGERY — LITHOTRIPSY, ESWL
Anesthesia: Moderate Sedation | Laterality: Left

## 2017-12-12 MED ORDER — TAMSULOSIN HCL 0.4 MG PO CAPS
0.4000 mg | ORAL_CAPSULE | Freq: Every day | ORAL | Status: DC
  Administered 2017-12-12 – 2017-12-13 (×2): 0.4 mg via ORAL
  Filled 2017-12-12 (×2): qty 1

## 2017-12-12 MED ORDER — DIPHENHYDRAMINE HCL 25 MG PO CAPS
ORAL_CAPSULE | ORAL | Status: AC
  Filled 2017-12-12: qty 1

## 2017-12-12 MED ORDER — MORPHINE SULFATE (PF) 4 MG/ML IV SOLN
2.0000 mg | INTRAVENOUS | Status: DC | PRN
  Administered 2017-12-12: 4 mg via INTRAVENOUS
  Filled 2017-12-12: qty 1

## 2017-12-12 MED ORDER — TAMSULOSIN HCL 0.4 MG PO CAPS: 0.4000 mg | ORAL_CAPSULE | Freq: Every day | ORAL | 0 refills | Status: DC

## 2017-12-12 MED ORDER — DIPHENHYDRAMINE HCL 25 MG PO CAPS
25.0000 mg | ORAL_CAPSULE | ORAL | Status: AC
  Administered 2017-12-12: 25 mg via ORAL

## 2017-12-12 MED ORDER — DIAZEPAM 5 MG PO TABS
10.0000 mg | ORAL_TABLET | ORAL | Status: AC
  Administered 2017-12-12: 10 mg via ORAL

## 2017-12-12 MED ORDER — METFORMIN HCL 500 MG PO TABS
1000.0000 mg | ORAL_TABLET | Freq: Two times a day (BID) | ORAL | Status: DC
  Administered 2017-12-12 – 2017-12-13 (×2): 1000 mg via ORAL
  Filled 2017-12-12 (×2): qty 2

## 2017-12-12 MED ORDER — LOSARTAN POTASSIUM 25 MG PO TABS
25.0000 mg | ORAL_TABLET | Freq: Every day | ORAL | Status: DC
  Administered 2017-12-12 – 2017-12-13 (×2): 25 mg via ORAL
  Filled 2017-12-12 (×2): qty 1

## 2017-12-12 MED ORDER — SODIUM CHLORIDE 0.9 % IV SOLN: INTRAVENOUS | Status: DC

## 2017-12-12 MED ORDER — METOPROLOL SUCCINATE ER 50 MG PO TB24
50.0000 mg | ORAL_TABLET | Freq: Every day | ORAL | Status: DC
  Administered 2017-12-12 – 2017-12-13 (×2): 50 mg via ORAL
  Filled 2017-12-12 (×3): qty 1

## 2017-12-12 MED ORDER — DIAZEPAM 5 MG PO TABS
ORAL_TABLET | ORAL | Status: AC
  Filled 2017-12-12: qty 2

## 2017-12-12 NOTE — Interval H&P Note (Signed)
History and Physical Interval Note:  12/12/2017 11:04 AM  Nathan Hansen.  has presented today for surgery, with the diagnosis of Kidney stone  The various methods of treatment have been discussed with the patient and family. After consideration of risks, benefits and other options for treatment, the patient has consented to  Procedure(s): EXTRACORPOREAL SHOCK WAVE LITHOTRIPSY (ESWL) (Left) as a surgical intervention .  The patient's history has been reviewed, patient examined, no change in status, stable for surgery.  I have reviewed the patient's chart and labs.  Questions were answered to the patient's satisfaction.     [place]

## 2017-12-12 NOTE — Interval H&P Note (Signed)
History and Physical Interval Note:  12/12/2017 11:05 AM  Nathan Hansen.  has presented today for surgery, with the diagnosis of Kidney stone  The various methods of treatment have been discussed with the patient and family. After consideration of risks, benefits and other options for treatment, the patient has consented to  Procedure(s): EXTRACORPOREAL SHOCK WAVE LITHOTRIPSY (ESWL) (Left) as a surgical intervention .  The patient's history has been reviewed, patient examined, no change in status, stable for surgery.  I have reviewed the patient's chart and labs.  Questions were answered to the patient's satisfaction.     [place]

## 2017-12-12 NOTE — OR Nursing (Signed)
Transferred to Rm 201 with report to [employer]

## 2017-12-13 ENCOUNTER — Encounter: Payer: Self-pay | Admitting: Urology

## 2017-12-13 LAB — GLUCOSE, CAPILLARY: Glucose-Capillary: 153 mg/dL — ABNORMAL HIGH (ref 65–99)

## 2017-12-13 MED ORDER — HYDROCODONE-ACETAMINOPHEN 5-325 MG PO TABS: 1.0000 | ORAL_TABLET | Freq: Four times a day (QID) | ORAL | Status: DC | PRN

## 2017-12-13 NOTE — Progress Notes (Signed)
Pt was given discharge instructions and was alerted to the appts that are coming. VS were WDL. IV removed without incident.

## 2017-12-13 NOTE — Discharge Summary (Signed)
Date of admission: 12/11/2017  Date of discharge: 12/13/2017  Admission diagnosis: Left ureteral calculus  Discharge diagnosis: Same  Secondary diagnoses:  Patient Active Problem List   Diagnosis Date Noted  . Left ureteral stone 12/11/2017    History and Physical: For full details, please see admission history and physical. Briefly, Nathan Hansen. is a 61 y.o. year old patient with refractory renal colic secondary to a left proximal ureteral calculus.  He was admitted for pain control.  Hospital Course: He was admitted for parenteral analgesia and underwent shockwave lithotripsy on 12/12/2017.  He had continued pain the day of the procedure necessitating an additional hospital stay.  On POD#1 his pain was minimal not requiring oral or parenteral analgesics. He had met discharge criteria: was eating a regular diet, was up and ambulating independently,  pain was well controlled, was voiding without a catheter, and was ready to for discharge.   Laboratory values:  Recent Labs    12/11/17 1152  WBC 10.9*  HGB 14.9  HCT 43.1   Recent Labs    12/11/17 1152  NA 130*  K 3.9  CL 94*  CO2 23  GLUCOSE 191*  BUN 32*  CREATININE 1.53*  CALCIUM 9.4   Recent Labs    12/11/17 1152  INR 0.97   No results for input(s): LABURIN in the last 72 hours. Results for orders placed or performed during the hospital encounter of 12/11/17  Surgical PCR screen     Status: None   Collection Time: 12/11/17  6:47 PM  Result Value Ref Range Status   MRSA, PCR NEGATIVE NEGATIVE Final   Staphylococcus aureus NEGATIVE NEGATIVE Final    Comment: (NOTE) The Xpert SA Assay (FDA approved for NASAL specimens in patients 21 years of age and older), is one component of a comprehensive surveillance program. It is not intended to diagnose infection nor to guide or monitor treatment.     Disposition: Home  Discharge instruction: The patient was instructed to be ambulatory but told to refrain from  heavy lifting, strenuous activity, or driving.    Discharge medications:  As per medical reconciliation  Followup: He is scheduled for follow-up on 12/25/2017 with a KUB.

## 2017-12-13 NOTE — Progress Notes (Signed)
Pain improved.  Complaining of lethargy, grogginess with oxycodone. Vital signs stable, afebrile  Impression/plan: Stable status post shockwave lithotripsy.  Will DC oxycodone and start hydrocodone.  Discharge later today if pain controlled.

## 2017-12-13 NOTE — Progress Notes (Signed)
Continues to feel better.  Has not required oral pain medication today.  He desires to be discharged.

## 2017-12-25 ENCOUNTER — Ambulatory Visit
Admission: RE | Admit: 2017-12-25 | Discharge: 2017-12-25 | Disposition: A | Discharge status: Home / Self Care | Payer: BLUE CROSS/BLUE SHIELD | Source: Ambulatory Visit | Attending: Urology | Admitting: Urology

## 2017-12-25 ENCOUNTER — Ambulatory Visit (INDEPENDENT_AMBULATORY_CARE_PROVIDER_SITE_OTHER): Payer: BLUE CROSS/BLUE SHIELD | Admitting: Urology

## 2017-12-25 VITALS — BP 114/59 | HR 57 | Ht 72.0 in | Wt 230.0 lb

## 2017-12-25 DIAGNOSIS — N201 Calculus of ureter: Secondary | ICD-10-CM | POA: Insufficient documentation

## 2017-12-25 NOTE — Progress Notes (Signed)
12/25/2017 10:20 AM   Nathan Dom. 1956/01/14 [id number]  Referring provider: Danelle Berry, NP [address] [place], [place] [address]  Chief Complaint  Patient presents with  . Nephrolithiasis    post ESWL    HPI: 61 year old male status post shockwave lithotripsy for an 8 mm left proximal ureteral calculus on 12/12/2017.  He states his pain has resolved but he has not passed any fragments.  He has occasional mild nausea.  Denies fever or chills.  His stone density was ~1200 HU.  KUB reviewed today remarkable for 3-4 fragments in the proximal ureter the largest measuring 4 mm   PMH: Past Medical History:  Diagnosis Date  . Diabetes mellitus without complication ([place])   . Irregular heart beat     Surgical History: Past Surgical History:  Procedure Laterality Date  . EXTRACORPOREAL SHOCK WAVE LITHOTRIPSY Left 12/12/2017   Procedure: EXTRACORPOREAL SHOCK WAVE LITHOTRIPSY (ESWL);  Surgeon: Abbie Sons, MD;  Location: [facility] ORS;  Service: Urology;  Laterality: Left;    Home Medications:  Allergies as of 12/25/2017   No Known Allergies     Medication List        Accurate as of 12/25/17 10:20 AM. Always use your most recent med list.          brimonidine 0.2 % ophthalmic solution Commonly known as:  ALPHAGAN   fluorometholone 0.1 % ophthalmic suspension Commonly known as:  FML   losartan 25 MG tablet Commonly known as:  COZAAR Take 25 mg by mouth daily.   metFORMIN 1000 MG tablet Commonly known as:  GLUCOPHAGE Take 1,000 mg by mouth 2 (two) times daily.   metoprolol succinate 50 MG 24 hr tablet Commonly known as:  TOPROL-XL Take 50 mg by mouth daily.   tamsulosin 0.4 MG Caps capsule Commonly known as:  FLOMAX Take 1 capsule (0.4 mg total) by mouth daily.       Allergies: No Known Allergies  Family History: Family History  Problem Relation Age of Onset  . Prostate cancer Neg Hx   . Bladder Cancer Neg Hx   . Kidney cancer Neg  Hx     Social History:  reports that  has never smoked. he has never used smokeless tobacco. He reports that he does not drink alcohol or use drugs.  ROS: UROLOGY Frequent Urination?: Yes Hard to postpone urination?: No Burning/pain with urination?: No Get up at night to urinate?: Yes Leakage of urine?: No Urine stream starts and stops?: No Trouble starting stream?: No Do you have to strain to urinate?: No Blood in urine?: No Urinary tract infection?: No Sexually transmitted disease?: No Injury to kidneys or bladder?: No Painful intercourse?: No Weak stream?: No Erection problems?: No Penile pain?: No  Gastrointestinal Nausea?: Yes Vomiting?: No Indigestion/heartburn?: No Diarrhea?: No Constipation?: Yes  Constitutional Fever: No Night sweats?: Yes Weight loss?: No Fatigue?: No  Skin Skin rash/lesions?: No Itching?: No  Eyes Blurred vision?: No Double vision?: No  Ears/Nose/Throat Sore throat?: No Sinus problems?: No  Hematologic/Lymphatic Swollen glands?: No Easy bruising?: No  Cardiovascular Leg swelling?: No Chest pain?: No  Respiratory Cough?: No Shortness of breath?: No  Endocrine Excessive thirst?: No  Musculoskeletal Back pain?: Yes Joint pain?: No  Neurological Headaches?: No Dizziness?: No  Psychologic Depression?: No Anxiety?: No  Physical Exam: BP (!) 114/59   Pulse (!) 57   Ht 6' (1.829 m)   Wt 230 lb (104.3 kg)   BMI 31.19 kg/m   Constitutional:  Alert and oriented, No acute distress. HEENT: [place] AT, moist mucus membranes.  Trachea midline, no masses. Cardiovascular: No clubbing, cyanosis, or edema. CV:RRR Respiratory: Normal respiratory effort, no increased work of breathing. Lungs:clear GI: Abdomen is soft, nontender, nondistended, no abdominal masses GU: No CVA tenderness.  Skin: No rashes, bruises or suspicious lesions. Lymph: No cervical or inguinal adenopathy. Neurologic: Grossly intact, no focal deficits,  moving all 4 extremities. Psychiatric: Normal mood and affect.  Laboratory Data: Lab Results  Component Value Date   WBC 10.9 (H) 12/11/2017   HGB 14.9 12/11/2017   HCT 43.1 12/11/2017   MCV 88.3 12/11/2017   PLT 210 12/11/2017    Lab Results  Component Value Date   CREATININE 1.53 (H) 12/11/2017   Pertinent Imaging:  Results for orders placed during the hospital encounter of 12/25/17  Abdomen 1 view (KUB)   Narrative CLINICAL DATA:  61 year old male history of left-sided kidney stones status post lithotripsy on 12/12/2017 presenting with pain and nausea.  EXAM: ABDOMEN - 1 VIEW  COMPARISON:  Abdominal radiograph 12/12/2017. CT the abdomen and pelvis 12/07/2017.  FINDINGS: Gas and stool are seen scattered throughout the colon extending to the level of the distal rectum. No pathologic distension of small bowel is noted. No gross evidence of pneumoperitoneum. Multiple small densities are noted just lateral to the left side of the superior endplate of L5, likely to represent tiny fragment of the stone previously noted in the middle third of the left ureter. The largest of these fragments measures up to 4 mm. 21 mm calcified gallstone again noted projecting over the right upper quadrant of the abdomen.  IMPRESSION: 1. Multiple small fragments of calculus in the left ureter at junction of middle and distal thirds, as above.   Electronically Signed   By: Vinnie Langton M.D.   On: 12/25/2017 08:07     Results for orders placed during the hospital encounter of 12/07/17  CT Renal Stone Study   Narrative CLINICAL DATA:  HX kidney stones, C/o left flank pain since yesterday. Denies gross hematuria. No surg of a/p.  EXAM: CT ABDOMEN AND PELVIS WITHOUT CONTRAST  TECHNIQUE: Multidetector CT imaging of the abdomen and pelvis was performed following the standard protocol without IV contrast.  COMPARISON:  None.  FINDINGS: Lower chest: Clear lung bases.  Heart  normal in size.  Hepatobiliary: Several low-density liver lesions are noted, largest arising from the anterior margin of segment 3 measuring 18 mm, all consistent with cysts. No other liver lesions. 2 cm gallstone. No gallbladder wall thickening or adjacent inflammation. No bile duct dilation.  Pancreas: Unremarkable. No pancreatic ductal dilatation or surrounding inflammatory changes.  Spleen: Normal in size without focal abnormality.  Adrenals/Urinary Tract: No adrenal masses.  There is mild left hydronephrosis and moderate dilation of proximal left ureter with significant left perinephric and periureteral stranding. This is due to an 8 mm stone in the proximal ureter. No other ureteral stones. No intrarenal stones. No renal masses. Right kidney and ureter are normal. Normal bladder.  Stomach/Bowel: Stomach is within normal limits. Appendix appears normal. No evidence of bowel wall thickening, distention, or inflammatory changes.  Vascular/Lymphatic: Aortic atherosclerosis. No enlarged abdominal or pelvic lymph nodes.  Reproductive: Prostate mildly enlarged.  Other: Small fat containing supraumbilical hernia. No bowel enters this. No ascites.  Musculoskeletal: No fracture or acute finding. No osteoblastic or osteolytic lesions.  IMPRESSION: 1. 8 mm stone in the proximal left ureter causes moderate obstructive uropathy. 2. No other acute findings.  No intrarenal stones. 3. Single gallstone.  No acute cholecystitis. 4. Small low-density liver lesions consistent with cysts. 5. Aortic atherosclerosis.   Electronically Signed   By: Lajean Manes M.D.   On: 12/07/2017 09:52     Assessment & Plan:    1. Ureteral calculus Fragmented calculus status post shockwave lithotripsy though only minimal progression.  We discussed options of a continued trial of passage, repeat shockwave lithotripsy and ureteroscopic removal.  He is minimally symptomatic at present and would  like a little longer to try and pass the calculi.  Will get a follow-up KUB next week.  Continue tamsulosin.    Abbie Sons, [place] [address], [place], [place] [address] [phone]

## 2018-01-01 ENCOUNTER — Telehealth: Payer: Self-pay

## 2018-01-01 DIAGNOSIS — N200 Calculus of kidney: Secondary | ICD-10-CM

## 2018-01-01 NOTE — Telephone Encounter (Signed)
-----   Message from Nori Riis, PA-C sent at 12/31/2017  9:19 PM EST ----- Please have Mr. Strout have a KUB sometime in the next 1 to 2 weeks.

## 2018-01-01 NOTE — Telephone Encounter (Signed)
Patient notified and order placed he will go next week

## 2018-01-10 ENCOUNTER — Ambulatory Visit
Admission: RE | Admit: 2018-01-10 | Discharge: 2018-01-10 | Disposition: A | Discharge status: Home / Self Care | Payer: BLUE CROSS/BLUE SHIELD | Source: Ambulatory Visit | Attending: Urology | Admitting: Urology

## 2018-01-10 ENCOUNTER — Telehealth: Payer: Self-pay

## 2018-01-10 DIAGNOSIS — N201 Calculus of ureter: Secondary | ICD-10-CM | POA: Diagnosis not present

## 2018-01-10 DIAGNOSIS — N200 Calculus of kidney: Secondary | ICD-10-CM | POA: Diagnosis present

## 2018-01-10 DIAGNOSIS — K8020 Calculus of gallbladder without cholecystitis without obstruction: Secondary | ICD-10-CM | POA: Insufficient documentation

## 2018-01-10 NOTE — Telephone Encounter (Signed)
-----   Message from Nori Riis, PA-C sent at 01/10/2018 10:37 AM EST ----- Patient needs an appointment.  Stone is still in ureter.

## 2018-01-10 NOTE — Telephone Encounter (Signed)
Patient notified and apt scheduled

## 2018-01-13 NOTE — Progress Notes (Signed)
01/14/2018 8:53 AM   Nathan Hansen. July 02, 1956 [id number]  Referring provider: Danelle Berry, NP [address] [place], [place] [address]  Chief Complaint  Patient presents with  . Follow-up    KUB results    HPI: 62 year old male status post shockwave lithotripsy for an 8 mm left proximal ureteral calculus on 12/12/2017.    He states his pain has resolved but he has not passed any fragments.  He has occasional mild nausea.  Denies fever or chills. His stone density was ~1200 HU.   KUB reviewed today remarkable for mid left ureteral calculi identified on 12/25/2017 have now migrated to the distal left ureter/left UVJ, the largest measuring 4 mm. No calcifications are identified overlying the renal shadows or proximal/mid ureters.  Today, he is complaining of nocturia.   He is not having flank pain, gross hematuria, fevers, chills, nausea or vomiting.  He states that he had recently seen his PCP and was told his kidney function was compromised.  His PCP is going to repeat his labs in one month and refer him to nephrology.    PMH: Past Medical History:  Diagnosis Date  . Diabetes mellitus without complication ([place])   . Irregular heart beat     Surgical History: Past Surgical History:  Procedure Laterality Date  . EXTRACORPOREAL SHOCK WAVE LITHOTRIPSY Left 12/12/2017   Procedure: EXTRACORPOREAL SHOCK WAVE LITHOTRIPSY (ESWL);  Surgeon: Abbie Sons, MD;  Location: [facility] ORS;  Service: Urology;  Laterality: Left;    Home Medications:  Allergies as of 01/14/2018   No Known Allergies     Medication List        Accurate as of 01/14/18  8:53 AM. Always use your most recent med list.          brimonidine 0.2 % ophthalmic solution Commonly known as:  ALPHAGAN   EASY TOUCH TEST test strip Generic drug:  glucose blood USE TO CHECK FASTING MORNING BLOOD SUGAR ONCE DAILY   fluorometholone 0.1 % ophthalmic suspension Commonly known as:  FML   losartan 25 MG  tablet Commonly known as:  COZAAR Take 25 mg by mouth daily.   metFORMIN 1000 MG tablet Commonly known as:  GLUCOPHAGE Take 1,000 mg by mouth 2 (two) times daily.   metoprolol succinate 50 MG 24 hr tablet Commonly known as:  TOPROL-XL Take 50 mg by mouth daily.   tamsulosin 0.4 MG Caps capsule Commonly known as:  FLOMAX Take 1 capsule (0.4 mg total) by mouth daily.       Allergies: No Known Allergies  Family History: Family History  Problem Relation Age of Onset  . Prostate cancer Neg Hx   . Bladder Cancer Neg Hx   . Kidney cancer Neg Hx     Social History:  reports that  has never smoked. he has never used smokeless tobacco. He reports that he does not drink alcohol or use drugs.  ROS: UROLOGY Frequent Urination?: No Hard to postpone urination?: No Burning/pain with urination?: No Get up at night to urinate?: Yes Leakage of urine?: No Urine stream starts and stops?: No Trouble starting stream?: No Do you have to strain to urinate?: No Blood in urine?: No Urinary tract infection?: No Sexually transmitted disease?: No Injury to kidneys or bladder?: No Painful intercourse?: No Weak stream?: No Erection problems?: No Penile pain?: No  Gastrointestinal Nausea?: No Vomiting?: No Indigestion/heartburn?: No Diarrhea?: No Constipation?: No  Constitutional Fever: No Night sweats?: No Weight loss?: No Fatigue?: No  Skin Skin rash/lesions?: No Itching?: No  Eyes Blurred vision?: No Double vision?: No  Ears/Nose/Throat Sore throat?: No Sinus problems?: No  Hematologic/Lymphatic Swollen glands?: No Easy bruising?: No  Cardiovascular Leg swelling?: No Chest pain?: No  Respiratory Cough?: No Shortness of breath?: No  Endocrine Excessive thirst?: No  Musculoskeletal Back pain?: Yes Joint pain?: No  Neurological Headaches?: No Dizziness?: No  Psychologic Depression?: No Anxiety?: No  Physical Exam: BP 131/73   Pulse (!) 57   Wt  239 lb 12.8 oz (108.8 kg)   BMI 32.52 kg/m   Constitutional:  Alert and oriented, No acute distress. HEENT: [place] AT, moist mucus membranes.  Trachea midline, no masses. Cardiovascular: No clubbing, cyanosis, or edema. CV:RRR Respiratory: Normal respiratory effort, no increased work of breathing. Lungs:clear GI: Abdomen is soft, nontender, nondistended, no abdominal masses GU: No CVA tenderness.  Skin: No rashes, bruises or suspicious lesions. Lymph: No cervical or inguinal adenopathy. Neurologic: Grossly intact, no focal deficits, moving all 4 extremities. Psychiatric: Normal mood and affect.  Laboratory Data: Lab Results  Component Value Date   WBC 10.9 (H) 12/11/2017   HGB 14.9 12/11/2017   HCT 43.1 12/11/2017   MCV 88.3 12/11/2017   PLT 210 12/11/2017    Lab Results  Component Value Date   CREATININE 1.53 (H) 12/11/2017  I have reviewed the labs.  Pertinent Imaging: CLINICAL DATA:  Follow-up urinary calculi.  EXAM: ABDOMEN - 1 VIEW  COMPARISON:  12/25/2017 radiograph and 12/07/2017 CT  FINDINGS: Mid left ureteral calculi identified on 12/25/2017 have now migrated to the distal left ureter/left UVJ, the largest measuring 4 mm. No calcifications are identified overlying the renal shadows or proximal/mid ureters.  A 2 cm calcified gallstone is again noted.  The bowel gas pattern is unremarkable.  IMPRESSION: Distal left ureter/UVJ calculi, migrated from the mid left ureter since 02/25/2017.  Cholelithiasis.   Electronically Signed   By: Margarette Canada M.D.   On: 01/10/2018 09:37   Results for orders placed during the hospital encounter of 12/25/17  Abdomen 1 view (KUB)   Narrative CLINICAL DATA:  62 year old male history of left-sided kidney stones status post lithotripsy on 12/12/2017 presenting with pain and nausea.  EXAM: ABDOMEN - 1 VIEW  COMPARISON:  Abdominal radiograph 12/12/2017. CT the abdomen and pelvis 12/07/2017.  FINDINGS: Gas  and stool are seen scattered throughout the colon extending to the level of the distal rectum. No pathologic distension of small bowel is noted. No gross evidence of pneumoperitoneum. Multiple small densities are noted just lateral to the left side of the superior endplate of L5, likely to represent tiny fragment of the stone previously noted in the middle third of the left ureter. The largest of these fragments measures up to 4 mm. 21 mm calcified gallstone again noted projecting over the right upper quadrant of the abdomen.  IMPRESSION: 1. Multiple small fragments of calculus in the left ureter at junction of middle and distal thirds, as above.   Electronically Signed   By: Vinnie Langton M.D.   On: 12/25/2017 08:07     Results for orders placed during the hospital encounter of 12/07/17  CT Renal Stone Study   Narrative CLINICAL DATA:  HX kidney stones, C/o left flank pain since yesterday. Denies gross hematuria. No surg of a/p.  EXAM: CT ABDOMEN AND PELVIS WITHOUT CONTRAST  TECHNIQUE: Multidetector CT imaging of the abdomen and pelvis was performed following the standard protocol without IV contrast.  COMPARISON:  None.  FINDINGS: Lower  chest: Clear lung bases.  Heart normal in size.  Hepatobiliary: Several low-density liver lesions are noted, largest arising from the anterior margin of segment 3 measuring 18 mm, all consistent with cysts. No other liver lesions. 2 cm gallstone. No gallbladder wall thickening or adjacent inflammation. No bile duct dilation.  Pancreas: Unremarkable. No pancreatic ductal dilatation or surrounding inflammatory changes.  Spleen: Normal in size without focal abnormality.  Adrenals/Urinary Tract: No adrenal masses.  There is mild left hydronephrosis and moderate dilation of proximal left ureter with significant left perinephric and periureteral stranding. This is due to an 8 mm stone in the proximal ureter. No other ureteral  stones. No intrarenal stones. No renal masses. Right kidney and ureter are normal. Normal bladder.  Stomach/Bowel: Stomach is within normal limits. Appendix appears normal. No evidence of bowel wall thickening, distention, or inflammatory changes.  Vascular/Lymphatic: Aortic atherosclerosis. No enlarged abdominal or pelvic lymph nodes.  Reproductive: Prostate mildly enlarged.  Other: Small fat containing supraumbilical hernia. No bowel enters this. No ascites.  Musculoskeletal: No fracture or acute finding. No osteoblastic or osteolytic lesions.  IMPRESSION: 1. 8 mm stone in the proximal left ureter causes moderate obstructive uropathy. 2. No other acute findings.  No intrarenal stones. 3. Single gallstone.  No acute cholecystitis. 4. Small low-density liver lesions consistent with cysts. 5. Aortic atherosclerosis.   Electronically Signed   By: Lajean Manes M.D.   On: 12/07/2017 09:52    I have independently reviewed the films  Assessment & Plan:    1. Ureteral calculus Fragmented calculus status post shockwave lithotripsy though only minimal progression.  We discussed options of a continued trial of passage, repeat shockwave lithotripsy and ureteroscopic removal.  He is minimally symptomatic at present and would like a little longer to try and pass the calculi.  He does not want to have ureteroscopic removal.  Will get a follow-up KUB next week.  Continue tamsulosin.  2. Elevated creatinine  - explained to the patient that his "kidney" function being abnormal was likely due to the ureteral stones  - I will obtain a BUN + creatinine today as I do not have access to PCP's records - I explained to the patient that if his kidney function is severely compromised we will have to move on stone removal quickly      [name], PA-C  [facility] [address] [place], [place] [address] [phone]

## 2018-01-14 ENCOUNTER — Encounter: Payer: Self-pay | Admitting: Urology

## 2018-01-14 ENCOUNTER — Ambulatory Visit: Payer: BLUE CROSS/BLUE SHIELD | Admitting: Urology

## 2018-01-14 VITALS — BP 131/73 | HR 57 | Wt 239.8 lb

## 2018-01-14 DIAGNOSIS — R7989 Other specified abnormal findings of blood chemistry: Secondary | ICD-10-CM

## 2018-01-14 DIAGNOSIS — N201 Calculus of ureter: Secondary | ICD-10-CM

## 2018-01-15 ENCOUNTER — Telehealth: Payer: Self-pay

## 2018-01-15 LAB — BUN+CREAT
BUN/Creatinine Ratio: 15 (ref 10–24)
BUN: 19 mg/dL (ref 8–27)
Creatinine, Ser: 1.29 mg/dL — ABNORMAL HIGH (ref 0.76–1.27)
GFR calc non Af Amer: 59 mL/min/{1.73_m2} — ABNORMAL LOW (ref >59)
GFR, EST AFRICAN AMERICAN: 68 mL/min/{1.73_m2} (ref >59)

## 2018-01-15 NOTE — Telephone Encounter (Signed)
-----   Message from Nori Riis, PA-C sent at 01/15/2018  8:01 AM EST ----- Please let Mr. Wenrich know that his kidney function continues to improve.

## 2018-01-15 NOTE — Telephone Encounter (Signed)
Spoke with pt in reference to lab results. Pt voiced understanding.  Pt requested Larene [place] be made aware he has passed about 8 stone fragments.

## 2018-01-19 NOTE — Progress Notes (Signed)
01/20/2018 9:07 AM   [name]. [birth year] [id number]  Referring provider: Danelle Berry, NP [address] [place], [place] [address]  Chief Complaint  Patient presents with  . Other    KUB    HPI: 62 year old male status post shockwave lithotripsy for an 8 mm left proximal ureteral calculus on 12/12/2017.    KUB performed on 01/10/2018 was remarkable for mid left ureteral calculi identified on 12/25/2017 have now migrated to the distal left ureter/left UVJ, the largest measuring 4 mm. No calcifications are identified overlying the renal shadows or proximal/mid ureters.  He states his pain has resolved and he has passed many fragments.  He did not bring in the fragments.  His stone density was ~1200 HU.   KUB reviewed today noted no nephrolithiasis.  His serum creatinine continues to improve.  It is now at 1.29.    Today, he is complaining of nocturia.   He is not having flank pain, gross hematuria, fevers, chills, nausea or vomiting.   PMH: Past Medical History:  Diagnosis Date  . Diabetes mellitus without complication ([place])   . Irregular heart beat     Surgical History: Past Surgical History:  Procedure Laterality Date  . EXTRACORPOREAL SHOCK WAVE LITHOTRIPSY Left 12/12/2017   Procedure: EXTRACORPOREAL SHOCK WAVE LITHOTRIPSY (ESWL);  Surgeon: Abbie Sons, MD;  Location: [facility] ORS;  Service: Urology;  Laterality: Left;    Home Medications:  Allergies as of 01/20/2018   No Known Allergies     Medication List        Accurate as of 01/20/18  9:07 AM. Always use your most recent med list.          brimonidine 0.2 % ophthalmic solution Commonly known as:  ALPHAGAN   EASY TOUCH TEST test strip Generic drug:  glucose blood USE TO CHECK FASTING MORNING BLOOD SUGAR ONCE DAILY   fluorometholone 0.1 % ophthalmic suspension Commonly known as:  FML   losartan 25 MG tablet Commonly known as:  COZAAR Take 25 mg by mouth daily.   metFORMIN 1000 MG  tablet Commonly known as:  GLUCOPHAGE Take 1,000 mg by mouth 2 (two) times daily.   metoprolol succinate 50 MG 24 hr tablet Commonly known as:  TOPROL-XL Take 50 mg by mouth daily.   tamsulosin 0.4 MG Caps capsule Commonly known as:  FLOMAX Take 1 capsule (0.4 mg total) by mouth daily.       Allergies: No Known Allergies  Family History: Family History  Problem Relation Age of Onset  . Prostate cancer Neg Hx   . Bladder Cancer Neg Hx   . Kidney cancer Neg Hx     Social History:  reports that  has never smoked. he has never used smokeless tobacco. He reports that he does not drink alcohol or use drugs.  ROS: UROLOGY Frequent Urination?: No Hard to postpone urination?: No Burning/pain with urination?: No Get up at night to urinate?: No Leakage of urine?: No Urine stream starts and stops?: No Trouble starting stream?: No Do you have to strain to urinate?: No Blood in urine?: No Urinary tract infection?: No Sexually transmitted disease?: No Injury to kidneys or bladder?: No Painful intercourse?: No Weak stream?: No Erection problems?: No Penile pain?: No  Gastrointestinal Nausea?: No Vomiting?: No Indigestion/heartburn?: No Diarrhea?: No Constipation?: No  Constitutional Fever: No Night sweats?: No Weight loss?: No Fatigue?: No  Skin Skin rash/lesions?: No Itching?: No  Eyes Blurred vision?: No Double vision?: No  Ears/Nose/Throat  Sore throat?: No Sinus problems?: No  Hematologic/Lymphatic Swollen glands?: No Easy bruising?: No  Cardiovascular Leg swelling?: No Chest pain?: No  Respiratory Cough?: No Shortness of breath?: No  Endocrine Excessive thirst?: No  Musculoskeletal Back pain?: Yes Joint pain?: No  Neurological Headaches?: No Dizziness?: No  Psychologic Depression?: No Anxiety?: No  Physical Exam: BP 123/73   Pulse (!) 50   Ht 6' (1.829 m)   Wt 233 lb (105.7 kg)   BMI 31.60 kg/m   Constitutional: Well  nourished. Alert and oriented, No acute distress. HEENT: [place] AT, moist mucus membranes. Trachea midline, no masses. Cardiovascular: No clubbing, cyanosis, or edema. Respiratory: Normal respiratory effort, no increased work of breathing. Skin: No rashes, bruises or suspicious lesions. Lymph: No cervical or inguinal adenopathy. Neurologic: Grossly intact, no focal deficits, moving all 4 extremities. Psychiatric: Normal mood and affect.  Laboratory Data: Lab Results  Component Value Date   WBC 10.9 (H) 12/11/2017   HGB 14.9 12/11/2017   HCT 43.1 12/11/2017   MCV 88.3 12/11/2017   PLT 210 12/11/2017   Component     Latest Ref Rng & Units 12/07/2017 12/11/2017 01/14/2018  BUN     8 - 27 mg/dL 36 (H) 32 (H) 19  Creatinine     0.76 - 1.27 mg/dL 1.77 (H) 1.53 (H) 1.29 (H)  GFR, Est Non African American     >59 mL/min/1.73 39 (L) 47 (L) 59 (L)  GFR, Est African American     >59 mL/min/1.73 45 (L) 54 (L) 68  BUN/Creatinine Ratio     10 - 24   15  I have reviewed the labs.  Pertinent Imaging: CLINICAL DATA:  Kidney stones  EXAM: ABDOMEN - 1 VIEW  COMPARISON:  01/10/2018; 12/25/2017; 12/12/2017; CT abdomen pelvis - 12/07/2017  FINDINGS: Grossly unchanged approximately 2.3 x 2.2 cm laminated stone overlying the right upper abdominal quadrant compatible with known gallstone.  No definitive abnormal opacities overlie the expected location of either renal fossa, ureter or the urinary bladder.  Punctate phleboliths overlies the right hemipelvis.  Paucity of bowel gas without evidence of enteric obstruction.  No definite acute osseus abnormalities. Atherosclerotic plaque within the abdominal aorta.  IMPRESSION: 1. No definite evidence of nephrolithiasis. 2. Approximately 2.3 cm laminated stone overlies the right upper abdominal quadrant compatible with known gallstone demonstrated on abdominal CT performed 12/07/2017   Electronically Signed   By: Sandi Mariscal  M.D.   On: 01/20/2018 08:41  CLINICAL DATA:  Follow-up urinary calculi.  EXAM: ABDOMEN - 1 VIEW  COMPARISON:  12/25/2017 radiograph and 12/07/2017 CT  FINDINGS: Mid left ureteral calculi identified on 12/25/2017 have now migrated to the distal left ureter/left UVJ, the largest measuring 4 mm. No calcifications are identified overlying the renal shadows or proximal/mid ureters.  A 2 cm calcified gallstone is again noted.  The bowel gas pattern is unremarkable.  IMPRESSION: Distal left ureter/UVJ calculi, migrated from the mid left ureter since 02/25/2017.  Cholelithiasis.   Electronically Signed   By: Margarette Canada M.D.   On: 01/10/2018 09:37   Results for orders placed during the hospital encounter of 12/25/17  Abdomen 1 view (KUB)   Narrative CLINICAL DATA:  62 year old male history of left-sided kidney stones status post lithotripsy on 12/12/2017 presenting with pain and nausea.  EXAM: ABDOMEN - 1 VIEW  COMPARISON:  Abdominal radiograph 12/12/2017. CT the abdomen and pelvis 12/07/2017.  FINDINGS: Gas and stool are seen scattered throughout the colon extending to the level of the  distal rectum. No pathologic distension of small bowel is noted. No gross evidence of pneumoperitoneum. Multiple small densities are noted just lateral to the left side of the superior endplate of L5, likely to represent tiny fragment of the stone previously noted in the middle third of the left ureter. The largest of these fragments measures up to 4 mm. 21 mm calcified gallstone again noted projecting over the right upper quadrant of the abdomen.  IMPRESSION: 1. Multiple small fragments of calculus in the left ureter at junction of middle and distal thirds, as above.   Electronically Signed   By: Vinnie Langton M.D.   On: 12/25/2017 08:07     Results for orders placed during the hospital encounter of 12/07/17  CT Renal Stone Study   Narrative CLINICAL DATA:  HX  kidney stones, C/o left flank pain since yesterday. Denies gross hematuria. No surg of a/p.  EXAM: CT ABDOMEN AND PELVIS WITHOUT CONTRAST  TECHNIQUE: Multidetector CT imaging of the abdomen and pelvis was performed following the standard protocol without IV contrast.  COMPARISON:  None.  FINDINGS: Lower chest: Clear lung bases.  Heart normal in size.  Hepatobiliary: Several low-density liver lesions are noted, largest arising from the anterior margin of segment 3 measuring 18 mm, all consistent with cysts. No other liver lesions. 2 cm gallstone. No gallbladder wall thickening or adjacent inflammation. No bile duct dilation.  Pancreas: Unremarkable. No pancreatic ductal dilatation or surrounding inflammatory changes.  Spleen: Normal in size without focal abnormality.  Adrenals/Urinary Tract: No adrenal masses.  There is mild left hydronephrosis and moderate dilation of proximal left ureter with significant left perinephric and periureteral stranding. This is due to an 8 mm stone in the proximal ureter. No other ureteral stones. No intrarenal stones. No renal masses. Right kidney and ureter are normal. Normal bladder.  Stomach/Bowel: Stomach is within normal limits. Appendix appears normal. No evidence of bowel wall thickening, distention, or inflammatory changes.  Vascular/Lymphatic: Aortic atherosclerosis. No enlarged abdominal or pelvic lymph nodes.  Reproductive: Prostate mildly enlarged.  Other: Small fat containing supraumbilical hernia. No bowel enters this. No ascites.  Musculoskeletal: No fracture or acute finding. No osteoblastic or osteolytic lesions.  IMPRESSION: 1. 8 mm stone in the proximal left ureter causes moderate obstructive uropathy. 2. No other acute findings.  No intrarenal stones. 3. Single gallstone.  No acute cholecystitis. 4. Small low-density liver lesions consistent with cysts. 5. Aortic atherosclerosis.   Electronically Signed    By: Lajean Manes M.D.   On: 12/07/2017 09:52    I have independently reviewed the films  Assessment & Plan:    1. Ureteral calculus Stones passed.  He did not capture them for analysis.  Continue tamsulosin.  Obtain an RUS to ensure resolution of hydronephrosis.    2. Elevated creatinine  - continues to improve   [name], PA-C  [facility] [address] [place], [place] [address] [phone]

## 2018-01-20 ENCOUNTER — Encounter: Payer: Self-pay | Admitting: Urology

## 2018-01-20 ENCOUNTER — Ambulatory Visit
Admission: RE | Admit: 2018-01-20 | Discharge: 2018-01-20 | Disposition: A | Discharge status: Home / Self Care | Payer: BLUE CROSS/BLUE SHIELD | Source: Ambulatory Visit | Attending: Urology | Admitting: Urology

## 2018-01-20 ENCOUNTER — Ambulatory Visit (INDEPENDENT_AMBULATORY_CARE_PROVIDER_SITE_OTHER): Payer: BLUE CROSS/BLUE SHIELD | Admitting: Urology

## 2018-01-20 ENCOUNTER — Other Ambulatory Visit: Payer: Self-pay | Admitting: Family Medicine

## 2018-01-20 VITALS — BP 123/73 | HR 50 | Ht 72.0 in | Wt 233.0 lb

## 2018-01-20 DIAGNOSIS — N200 Calculus of kidney: Secondary | ICD-10-CM

## 2018-01-20 DIAGNOSIS — R935 Abnormal findings on diagnostic imaging of other abdominal regions, including retroperitoneum: Secondary | ICD-10-CM | POA: Diagnosis not present

## 2018-01-20 DIAGNOSIS — R7989 Other specified abnormal findings of blood chemistry: Secondary | ICD-10-CM

## 2018-01-20 DIAGNOSIS — N201 Calculus of ureter: Secondary | ICD-10-CM

## 2018-02-10 ENCOUNTER — Ambulatory Visit
Admission: RE | Admit: 2018-02-10 | Discharge: 2018-02-10 | Disposition: A | Discharge status: Home / Self Care | Payer: BLUE CROSS/BLUE SHIELD | Source: Ambulatory Visit | Attending: Urology | Admitting: Urology

## 2018-02-10 DIAGNOSIS — N201 Calculus of ureter: Secondary | ICD-10-CM

## 2018-02-10 DIAGNOSIS — R7989 Other specified abnormal findings of blood chemistry: Secondary | ICD-10-CM

## 2018-02-17 ENCOUNTER — Ambulatory Visit: Payer: BLUE CROSS/BLUE SHIELD | Admitting: Urology

## 2018-02-17 ENCOUNTER — Encounter: Payer: Self-pay | Admitting: Urology

## 2018-02-17 VITALS — BP 154/81 | HR 63 | Ht 72.0 in | Wt 249.0 lb

## 2018-02-17 DIAGNOSIS — Z87442 Personal history of urinary calculi: Secondary | ICD-10-CM | POA: Diagnosis not present

## 2018-02-17 DIAGNOSIS — Z125 Encounter for screening for malignant neoplasm of prostate: Secondary | ICD-10-CM | POA: Diagnosis not present

## 2018-02-17 NOTE — Progress Notes (Signed)
02/17/2018 8:43 AM   Nathan Hansen. Oct 02, 1956 [id number]  Referring provider: Danelle Berry, NP [address] [place], [place] [address]  Chief Complaint  Patient presents with  . Follow-up    Results RUS    HPI: 62 yo WM with a history of nephrolithiasis who presents today to review his RUS results.  Status post shockwave lithotripsy for an 8 mm left proximal ureteral calculus on 12/12/2017.    KUB performed on 01/10/2018 was remarkable for mid left ureteral calculi identified on 12/25/2017 have now migrated to the distal left ureter/left UVJ, the largest measuring 4 mm. No calcifications are identified overlying the renal shadows or proximal/mid ureters.  On 01/20/2018, he stated his pain has resolved and he has passed many fragments.  He did not bring in the fragments.  His stone density was ~1200 HU.   KUB reviewed on 01/20/2018 noted no nephrolithiasis.  His serum creatinine continues to improve.  It is now at 1.29.    RUS performed on 02/10/2018 noted no hydronephrosis.    PMH: Past Medical History:  Diagnosis Date  . Diabetes mellitus without complication ([place])   . Irregular heart beat     Surgical History: Past Surgical History:  Procedure Laterality Date  . EXTRACORPOREAL SHOCK WAVE LITHOTRIPSY Left 12/12/2017   Procedure: EXTRACORPOREAL SHOCK WAVE LITHOTRIPSY (ESWL);  Surgeon: Abbie Sons, MD;  Location: [facility] ORS;  Service: Urology;  Laterality: Left;    Home Medications:  Allergies as of 02/17/2018   No Known Allergies     Medication List        Accurate as of 02/17/18  8:43 AM. Always use your most recent med list.          brimonidine 0.2 % ophthalmic solution Commonly known as:  ALPHAGAN   EASY TOUCH TEST test strip Generic drug:  glucose blood USE TO CHECK FASTING MORNING BLOOD SUGAR ONCE DAILY   fluorometholone 0.1 % ophthalmic suspension Commonly known as:  FML   losartan 25 MG tablet Commonly known as:  COZAAR Take 25  mg by mouth daily.   metFORMIN 1000 MG tablet Commonly known as:  GLUCOPHAGE Take 1,000 mg by mouth 2 (two) times daily.   metoprolol succinate 50 MG 24 hr tablet Commonly known as:  TOPROL-XL Take 50 mg by mouth daily.   tamsulosin 0.4 MG Caps capsule Commonly known as:  FLOMAX Take 1 capsule (0.4 mg total) by mouth daily.       Allergies: No Known Allergies  Family History: Family History  Problem Relation Age of Onset  . Prostate cancer Neg Hx   . Bladder Cancer Neg Hx   . Kidney cancer Neg Hx     Social History:  reports that  has never smoked. he has never used smokeless tobacco. He reports that he does not drink alcohol or use drugs.  ROS: UROLOGY Frequent Urination?: No Hard to postpone urination?: No Burning/pain with urination?: No Get up at night to urinate?: No Leakage of urine?: No Urine stream starts and stops?: No Trouble starting stream?: No Do you have to strain to urinate?: No Blood in urine?: No Urinary tract infection?: No Sexually transmitted disease?: No Injury to kidneys or bladder?: No Painful intercourse?: No Weak stream?: No Erection problems?: No Penile pain?: No  Gastrointestinal Nausea?: No Vomiting?: No Indigestion/heartburn?: No Diarrhea?: No Constipation?: No  Constitutional Fever: No Night sweats?: No Weight loss?: No Fatigue?: No  Skin Skin rash/lesions?: No Itching?: No  Eyes Blurred vision?:  No Double vision?: No  Ears/Nose/Throat Sore throat?: No Sinus problems?: No  Hematologic/Lymphatic Swollen glands?: No Easy bruising?: No  Cardiovascular Leg swelling?: No Chest pain?: No  Respiratory Cough?: No Shortness of breath?: No  Endocrine Excessive thirst?: No  Musculoskeletal Back pain?: No Joint pain?: No  Neurological Headaches?: No Dizziness?: No  Psychologic Depression?: No Anxiety?: No  Physical Exam: BP (!) 154/81   Pulse 63   Ht 6' (1.829 m)   Wt 249 lb (112.9 kg)   BMI  33.77 kg/m   Constitutional: Well nourished. Alert and oriented, No acute distress. HEENT: [place] AT, moist mucus membranes. Trachea midline, no masses. Cardiovascular: No clubbing, cyanosis, or edema. Respiratory: Normal respiratory effort, no increased work of breathing. Skin: No rashes, bruises or suspicious lesions. Lymph: No cervical or inguinal adenopathy. Neurologic: Grossly intact, no focal deficits, moving all 4 extremities. Psychiatric: Normal mood and affect.  Laboratory Data: Lab Results  Component Value Date   WBC 10.9 (H) 12/11/2017   HGB 14.9 12/11/2017   HCT 43.1 12/11/2017   MCV 88.3 12/11/2017   PLT 210 12/11/2017   Component     Latest Ref Rng & Units 12/07/2017 12/11/2017 01/14/2018  BUN     8 - 27 mg/dL 36 (H) 32 (H) 19  Creatinine     0.76 - 1.27 mg/dL 1.77 (H) 1.53 (H) 1.29 (H)  GFR, Est Non African American     >59 mL/min/1.73 39 (L) 47 (L) 59 (L)  GFR, Est African American     >59 mL/min/1.73 45 (L) 54 (L) 68  BUN/Creatinine Ratio     10 - 24   15  I have reviewed the labs.  Pertinent Imaging: CLINICAL DATA:  Ureteral calculus, elevated creatinine  EXAM: RENAL / URINARY TRACT ULTRASOUND COMPLETE  COMPARISON:  CT abdomen/pelvis 12/07/2017  FINDINGS: Right Kidney:  Length: 13.6 cm. Normal cortical thickness and echogenicity. No mass, hydronephrosis or shadowing calcification.  Left Kidney:  Length: 13.1 cm. Normal cortical thickness and echogenicity. No mass, hydronephrosis or shadowing calcification. LEFT hydronephrosis seen on the prior exam no longer identified.  Bladder:  Appearance.  BILATERAL ureteral jets noted.  IMPRESSION: Normal exam.   Electronically Signed   By: Lavonia Dana M.D.   On: 02/10/2018 08:30  I have independently reviewed the films  Assessment & Plan:    1. History of nephrolithiasis  - stone composition is unknown  - advised to drink 2.5 L of water daily, avoid sodas and reduce salt and  protein intake  - offered 24 hour urine work up - patient deferred  - Advised to contact our office or seek treatment in the ED if becomes febrile or pain/ vomiting are difficult control in order to arrange for emergent/urgent intervention  - RTC in one year for KUB   2. PSA screening  - discussed with the patient that the AUA panel feels that in men age 63 to 26 years, there was sufficient certainty that the benefits of screening could outweigh the harms that a recommendation of shared decision-making in this age group was justified - he will speak with his PCP regarding his screenings    [name], PA-C  [facility] [address] [place], [place] [address] [phone]

## 2018-03-06 DIAGNOSIS — C678 Malignant neoplasm of overlapping sites of bladder: Secondary | ICD-10-CM

## 2018-03-08 IMAGING — CR DG ABDOMEN 1V
1 series · 2 of 2 positions shown · non-contrast
Comparison: 01/10/2018; 12/25/2017; 12/12/2017; CT abdomen pelvis -
12/07/2017

CLINICAL DATA: Kidney stones

EXAM:
ABDOMEN - 1 VIEW

[Series 1: dg abd 1 view · 0.14mm/px · 2 of 2 slices shown]
[im 1/2]
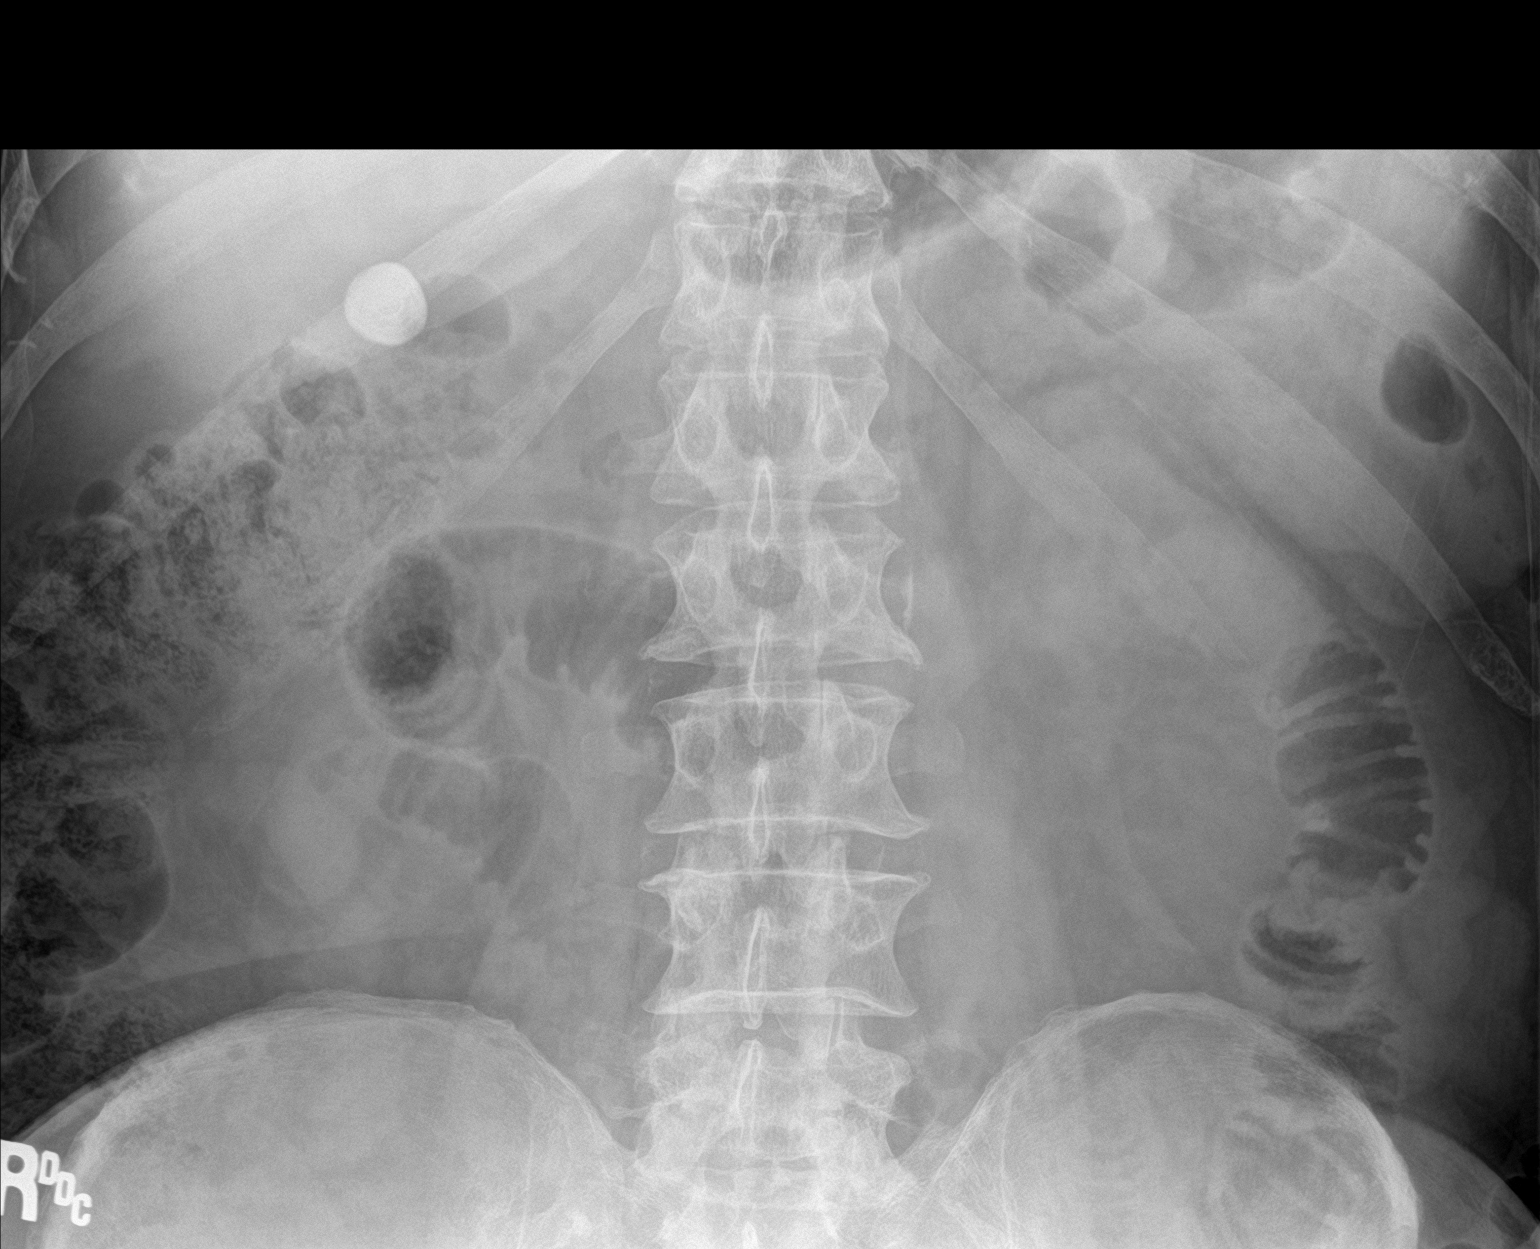
[im 2/2]
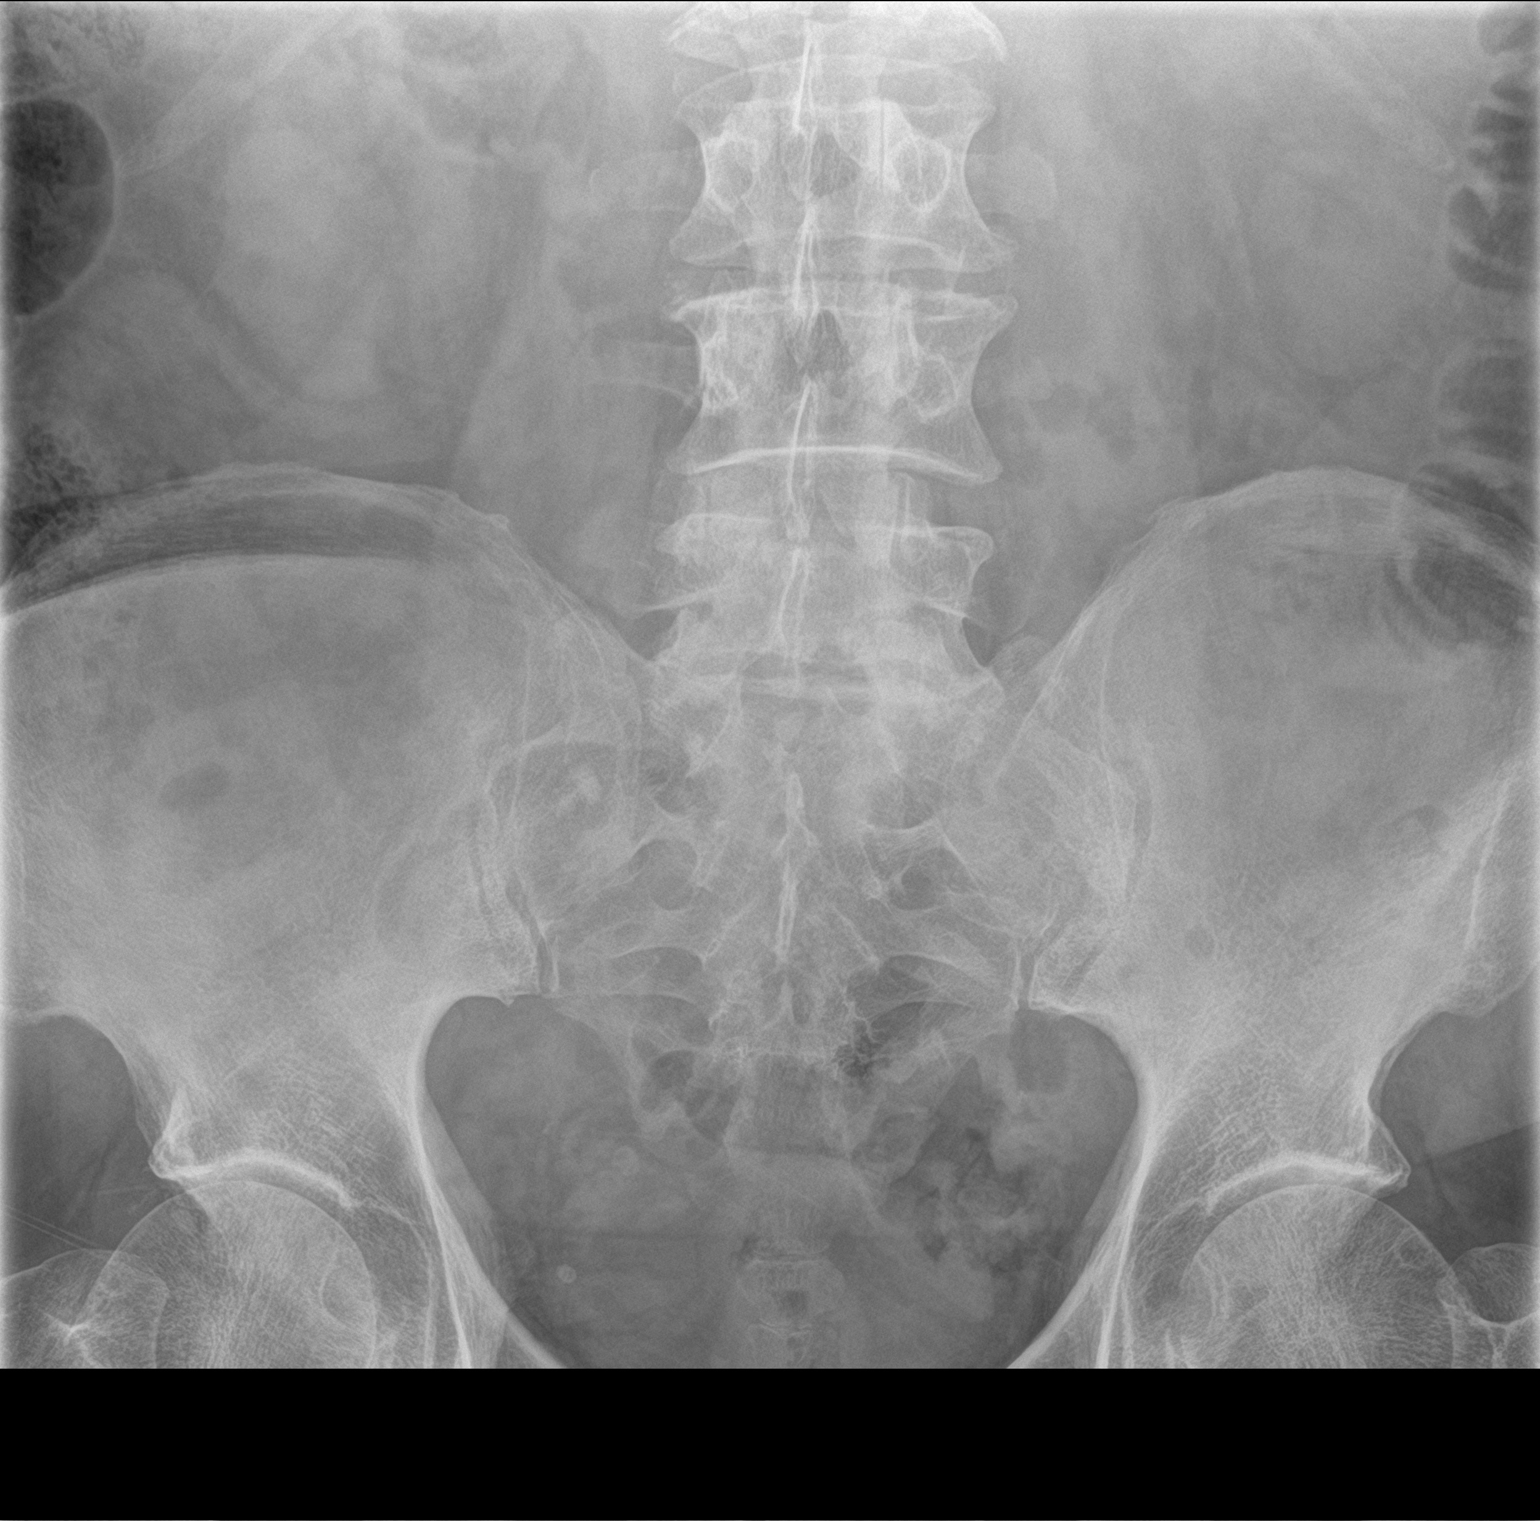

[2 of 2 positions shown; findings below may reference images not displayed]

FINDINGS: Grossly unchanged approximately 2.3 x 2.2 cm laminated stone
overlying the right upper abdominal quadrant compatible with known
gallstone.

No definitive abnormal opacities overlie the expected location of
either renal fossa, ureter or the urinary bladder.

Punctate phleboliths overlies the right hemipelvis.

Paucity of bowel gas without evidence of enteric obstruction.

No definite acute osseus abnormalities. Atherosclerotic plaque
within the abdominal aorta.
IMPRESSION: 1. No definite evidence of nephrolithiasis.
2. Approximately 2.3 cm laminated stone overlies the right upper
abdominal quadrant compatible with known gallstone demonstrated on
abdominal CT performed 12/07/2017

## 2018-05-12 ENCOUNTER — Telehealth: Payer: Self-pay | Admitting: Urology

## 2018-05-12 ENCOUNTER — Other Ambulatory Visit: Payer: Self-pay | Admitting: Urology

## 2018-05-12 MED ORDER — TAMSULOSIN HCL 0.4 MG PO CAPS: 0.4000 mg | ORAL_CAPSULE | Freq: Every day | ORAL | 12 refills | Status: DC

## 2018-05-12 NOTE — Progress Notes (Unsigned)
Refills for tamsulosin sent to pharmacy.

## 2018-05-12 NOTE — Telephone Encounter (Signed)
Pt needs refill for Flomax 0.4 sent to [facility].

## 2019-02-16 ENCOUNTER — Ambulatory Visit: Payer: BLUE CROSS/BLUE SHIELD | Admitting: Urology

## 2019-06-03 ENCOUNTER — Other Ambulatory Visit: Payer: Self-pay | Admitting: Urology

## 2020-04-25 ENCOUNTER — Ambulatory Visit: Payer: Medicare Other

## 2020-04-25 ENCOUNTER — Encounter: Payer: Self-pay | Admitting: Emergency Medicine

## 2020-04-25 ENCOUNTER — Other Ambulatory Visit: Payer: Self-pay

## 2020-04-25 ENCOUNTER — Ambulatory Visit
Admission: EM | Admit: 2020-04-25 | Discharge: 2020-04-25 | Disposition: A | Discharge status: Home / Self Care | Payer: Medicare Other | Attending: Emergency Medicine | Admitting: Emergency Medicine

## 2020-04-25 DIAGNOSIS — S61419A Laceration without foreign body of unspecified hand, initial encounter: Secondary | ICD-10-CM | POA: Insufficient documentation

## 2020-04-25 DIAGNOSIS — Z23 Encounter for immunization: Secondary | ICD-10-CM | POA: Diagnosis not present

## 2020-04-25 DIAGNOSIS — S60221A Contusion of right hand, initial encounter: Secondary | ICD-10-CM | POA: Diagnosis present

## 2020-04-25 MED ORDER — MUPIROCIN 2 % EX OINT: 1.0000 "application " | TOPICAL_OINTMENT | Freq: Three times a day (TID) | CUTANEOUS | 0 refills | Status: AC

## 2020-04-25 MED ORDER — TETANUS-DIPHTH-ACELL PERTUSSIS 5-2.5-18.5 LF-MCG/0.5 IM SUSP
0.5000 mL | Freq: Once | INTRAMUSCULAR | Status: AC
  Administered 2020-04-25: 16:00:00 0.5 mL via INTRAMUSCULAR

## 2020-04-25 NOTE — ED Provider Notes (Signed)
[facility]    CSN: [id number] Arrival date & time: 04/25/20  1515      History   Chief Complaint Chief Complaint  Patient presents with  . Hand Injury    skin tare    HPI Nathan Hansen. is a 64 y.o. male.   HPI  64 year old male presents with a skin tear on the dorsum of his right dominant hand that happened yesterday around 630.  He states that he was reaching up under the mower with his right hand to replace check a drive belt and on the way out was unable to move his hand without scraping it and consequently scraped the dorsum of his hand.  At this time the wound is 21hours injury.  He is not current on his tetanus toxoid.          Past Medical History:  Diagnosis Date  . Diabetes mellitus without complication ([place])   . Irregular heart beat     Patient Active Problem List   Diagnosis Date Noted  . Left ureteral stone 12/11/2017    Past Surgical History:  Procedure Laterality Date  . EXTRACORPOREAL SHOCK WAVE LITHOTRIPSY Left 12/12/2017   Procedure: EXTRACORPOREAL SHOCK WAVE LITHOTRIPSY (ESWL);  Surgeon: Abbie Sons, MD;  Location: [facility] ORS;  Service: Urology;  Laterality: Left;       Home Medications    Prior to Admission medications   Medication Sig Start Date End Date Taking? Authorizing Provider  brimonidine (ALPHAGAN) 0.2 % ophthalmic solution  12/23/17  Yes [provider]  EASY TOUCH TEST test strip USE TO CHECK FASTING MORNING BLOOD SUGAR ONCE DAILY 12/23/17  Yes [provider]  fluorometholone (FML) 0.1 % ophthalmic suspension  12/23/17  Yes [provider]  losartan (COZAAR) 25 MG tablet Take 25 mg by mouth daily. 11/14/17  Yes [provider]  metFORMIN (GLUCOPHAGE) 1000 MG tablet Take 1,000 mg by mouth 2 (two) times daily. 11/14/17  Yes [provider]  metoprolol succinate (TOPROL-XL) 50 MG 24 hr tablet Take 50 mg by mouth daily. 11/14/17  Yes [provider]    tamsulosin (FLOMAX) 0.4 MG CAPS capsule Take 1 capsule (0.4 mg total) by mouth daily. 05/12/18  Yes McGowan, Larene Beach A, PA-C  mupirocin ointment (BACTROBAN) 2 % Apply 1 application topically 3 (three) times daily. 04/25/20   Lorin Picket, PA-C    Family History Family History  Problem Relation Age of Onset  . Diabetes Father   . Prostate cancer Neg Hx   . Bladder Cancer Neg Hx   . Kidney cancer Neg Hx     Social History Social History   Tobacco Use  . Smoking status: Never Smoker  . Smokeless tobacco: Never Used  Substance Use Topics  . Alcohol use: No  . Drug use: No     Allergies   Patient has no known allergies.   Review of Systems Review of Systems  Constitutional: Positive for activity change. Negative for appetite change, chills, diaphoresis, fatigue and fever.  Skin: Positive for wound.  All other systems reviewed and are negative.    Physical Exam Triage Vital Signs ED Triage Vitals  Enc Vitals Group     BP 04/25/20 1537 (!) 151/81     Pulse Rate 04/25/20 1537 (!) 56     Resp 04/25/20 1537 18     Temp 04/25/20 1537 98.3 F (36.8 C)     Temp Source 04/25/20 1537 Oral     SpO2 04/25/20  1537 97 %     Weight 04/25/20 1533 248 lb 14.4 oz (112.9 kg)     Height 04/25/20 1533 6' (1.829 m)     Head Circumference --      Peak Flow --      Pain Score 04/25/20 1533 4     Pain Loc --      Pain Edu? --      Excl. in [place]? --    No data found.  Updated Vital Signs BP (!) 151/81 (BP Location: Left Arm)   Pulse (!) 56   Temp 98.3 F (36.8 C) (Oral)   Resp 18   Ht 6' (1.829 m)   Wt 248 lb 14.4 oz (112.9 kg)   SpO2 97%   BMI 33.76 kg/m   Visual Acuity Right Eye Distance:   Left Eye Distance:   Bilateral Distance:    Right Eye Near:   Left Eye Near:    Bilateral Near:     Physical Exam Vitals and nursing note reviewed.  Constitutional:      General: He is not in acute distress.    Appearance: Normal appearance. He is obese. He is not  ill-appearing or toxic-appearing.  HENT:     Head: Normocephalic and atraumatic.  Eyes:     Conjunctiva/sclera: Conjunctivae normal.  Musculoskeletal:        General: Tenderness and signs of injury present. Normal range of motion.     Cervical back: Normal range of motion and neck supple.  Skin:    General: Skin is warm and dry.     Comments: Examination of the right hand dorsum shows a transverse skin tear there is very mild tissue loss present.  Does not penetrate the dermis.  Motion of his hand is full.  There is no crepitus present.  Does have edema of the dorsum of the hand.  Neurological:     General: No focal deficit present.     Mental Status: He is alert and oriented to person, place, and time.  Psychiatric:        Mood and Affect: Mood normal.        Behavior: Behavior normal.        Thought Content: Thought content normal.        Judgment: Judgment normal.      UC Treatments / Results  Labs (all labs ordered are listed, but only abnormal results are displayed) Labs Reviewed - No data to display  EKG   Radiology DG Hand Complete Right  Result Date: 04/25/2020 CLINICAL DATA:  Injury with posterior laceration EXAM: RIGHT HAND - COMPLETE 3+ VIEW COMPARISON:  None. FINDINGS: Negative for fracture. No significant arthropathy. Posterior soft tissue injury. No foreign body. IMPRESSION: Dorsal soft tissue injury.  Negative for fracture Electronically Signed   By: Franchot Gallo M.D.   On: 04/25/2020 16:20    Procedures Procedures (including critical care time)  Wound was thoroughly cleansed with Betadine and tap water.  Last the skin was removed sharply with scissors and forceps.  Very small amount of skin removed.  A dry sterile dressing with antibiotic ointment was applied.  Patient tolerated procedure well.  Medications Ordered in UC Medications  Tdap (BOOSTRIX) injection 0.5 mL (0.5 mLs Intramuscular Given 04/25/20 1543)    Initial Impression / Assessment and Plan  / UC Course  I have reviewed the triage vital signs and the nursing notes.  Pertinent labs & imaging results that were available during my care of the patient  were reviewed by me and considered in my medical decision making (see chart for details).   64 year old male presented today 21 hours after sustaining a injury to his right dominant hand.  He injured it on a lawn more yesterday around 630.  He continued to have discomfort and his hand was swollen when he came in for examination.  Examination revealed a superficial skin tear over the dorsum of his hand with tenderness over the fourth metacarpal at the midpoint adjacent to the skin tear.  Skin tear measured approximately 2.5 cm.  In length and 1 cm width.  The area was cleansed with Betadine and tap water.  The devitalized skin was sharply removed with scissors and forceps.  Area was then dressed in a dry sterile dressing with antibiotic ointment.  He received tetanus toxoid.  Prescription for mupirocin was called in that he will use 3 times daily until the wound has healed.  Return for any signs or symptoms of infection.  Final Clinical Impressions(s) / UC Diagnoses   Final diagnoses:  Contusion of right hand, initial encounter  Skin tear of hand without complication, initial encounter     Discharge Instructions     Keep wound clean.  Apply mupirocin ointment to the wound 3 times daily until the wound heals.  Elevate your hand above the level of your heart sufficiently to control swelling and discomfort.  If you notice any signs of infection such as increased swelling increased pain discharge or red streaks going up your arm return to our clinic immediately.    ED Prescriptions    Medication Sig Dispense Auth. Provider   mupirocin ointment (BACTROBAN) 2 % Apply 1 application topically 3 (three) times daily. 22 g Lorin Picket, PA-C     PDMP not reviewed this encounter.   Lorin Picket, PA-C 04/25/20 1648

## 2020-04-25 NOTE — ED Triage Notes (Signed)
Pt states that he cut his hand on the lawn mower yesterday around 6:30. He has a skin tare on the top of his right hand. Pt unsure when his last td was, will update today.

## 2020-04-25 NOTE — Discharge Instructions (Addendum)
Keep wound clean.  Apply mupirocin ointment to the wound 3 times daily until the wound heals.  Elevate your hand above the level of your heart sufficiently to control swelling and discomfort.  If you notice any signs of infection such as increased swelling increased pain discharge or red streaks going up your arm return to our clinic immediately.

## 2020-04-25 NOTE — ED Notes (Signed)
Applied non stick gauze and co ban to right hand.

## 2023-01-31 ENCOUNTER — Other Ambulatory Visit: Payer: Self-pay | Admitting: Nurse Practitioner

## 2023-02-27 ENCOUNTER — Encounter: Payer: Self-pay | Admitting: Nurse Practitioner

## 2023-05-13 ENCOUNTER — Other Ambulatory Visit: Payer: Medicare Other

## 2023-05-13 ENCOUNTER — Other Ambulatory Visit: Payer: Self-pay | Admitting: Nurse Practitioner

## 2023-05-13 DIAGNOSIS — I10 Essential (primary) hypertension: Secondary | ICD-10-CM

## 2023-05-13 DIAGNOSIS — E663 Overweight: Secondary | ICD-10-CM

## 2023-05-13 DIAGNOSIS — R7303 Prediabetes: Secondary | ICD-10-CM

## 2023-05-14 LAB — CMP14+EGFR
ALT: 13 IU/L (ref 0–44)
AST: 14 IU/L (ref 0–40)
Albumin/Globulin Ratio: 1.6 (ref 1.2–2.2)
Albumin: 4.2 g/dL (ref 3.9–4.9)
Alkaline Phosphatase: 63 IU/L (ref 44–121)
BUN/Creatinine Ratio: 19 (ref 10–24)
BUN: 21 mg/dL (ref 8–27)
Bilirubin Total: 0.7 mg/dL (ref 0.0–1.2)
CO2: 19 mmol/L — ABNORMAL LOW (ref 20–29)
Calcium: 8.9 mg/dL (ref 8.6–10.2)
Chloride: 103 mmol/L (ref 96–106)
Creatinine, Ser: 1.08 mg/dL (ref 0.76–1.27)
Globulin, Total: 2.6 g/dL (ref 1.5–4.5)
Glucose: 142 mg/dL — ABNORMAL HIGH (ref 70–99)
Potassium: 4.2 mmol/L (ref 3.5–5.2)
Sodium: 141 mmol/L (ref 134–144)
Total Protein: 6.8 g/dL (ref 6.0–8.5)
eGFR: 75 mL/min/{1.73_m2} (ref >59)

## 2023-05-14 LAB — HEMOGLOBIN A1C
Est. average glucose Bld gHb Est-mCnc: 148 mg/dL
Hgb A1c MFr Bld: 6.8 % — ABNORMAL HIGH (ref 4.8–5.6)

## 2023-05-14 LAB — LIPID PANEL
Chol/HDL Ratio: 2.8 ratio (ref 0.0–5.0)
Cholesterol, Total: 82 mg/dL — ABNORMAL LOW (ref 100–199)
HDL: 29 mg/dL — ABNORMAL LOW (ref >39)
LDL Chol Calc (NIH): 32 mg/dL (ref 0–99)
Triglycerides: 109 mg/dL (ref 0–149)
VLDL Cholesterol Cal: 21 mg/dL (ref 5–40)

## 2023-05-14 LAB — TSH: TSH: 1.39 u[IU]/mL (ref 0.450–4.500)

## 2023-05-17 ENCOUNTER — Ambulatory Visit: Payer: Medicare Other | Admitting: Nurse Practitioner

## 2023-05-27 ENCOUNTER — Ambulatory Visit (INDEPENDENT_AMBULATORY_CARE_PROVIDER_SITE_OTHER): Payer: Medicare Other | Admitting: Nurse Practitioner

## 2023-05-27 ENCOUNTER — Encounter: Payer: Self-pay | Admitting: Nurse Practitioner

## 2023-05-27 VITALS — BP 140/80 | HR 74 | Ht 72.0 in | Wt 246.6 lb

## 2023-05-27 DIAGNOSIS — E663 Overweight: Secondary | ICD-10-CM

## 2023-05-27 DIAGNOSIS — E119 Type 2 diabetes mellitus without complications: Secondary | ICD-10-CM

## 2023-05-27 DIAGNOSIS — E782 Mixed hyperlipidemia: Secondary | ICD-10-CM | POA: Diagnosis not present

## 2023-05-27 NOTE — Progress Notes (Signed)
Established Patient Office Visit  Subjective:  Patient ID: Nathan Hansen., male    DOB: May 19, 1956  Age: 67 y.o. MRN: [id number]  Chief Complaint  Patient presents with   Follow-up    4 month follow up with lab results    Patient here today for 4 month follow up and review of recent fasting labs, LDL is at 32 mg/dl, W0J is at 8.1%, patient congratulated on good diabetic control.  Patient reports feeling well overall, no back pain concerns.      No other concerns at this time.   Past Medical History:  Diagnosis Date   Diabetes mellitus without complication (HCC)    Irregular heart beat     Past Surgical History:  Procedure Laterality Date   EXTRACORPOREAL SHOCK WAVE LITHOTRIPSY Left 12/12/2017   Procedure: EXTRACORPOREAL SHOCK WAVE LITHOTRIPSY (ESWL);  Surgeon: Riki Altes, MD;  Location: [facility] ORS;  Service: Urology;  Laterality: Left;    Social History   Socioeconomic History   Marital status: Married    Spouse name: Not on file   Number of children: Not on file   Years of education: Not on file   Highest education level: Not on file  Occupational History   Not on file  Tobacco Use   Smoking status: Never   Smokeless tobacco: Never  Vaping Use   Vaping Use: Never used  Substance and Sexual Activity   Alcohol use: No   Drug use: No   Sexual activity: Never  Other Topics Concern   Not on file  Social History Narrative   Not on file   Social Determinants of Health   Financial Resource Strain: Not on file  Food Insecurity: Not on file  Transportation Needs: Not on file  Physical Activity: Not on file  Stress: Not on file  Social Connections: Not on file  Intimate Partner Violence: Not on file    Family History  Problem Relation Age of Onset   Diabetes Father    Prostate cancer Neg Hx    Bladder Cancer Neg Hx    Kidney cancer Neg Hx     No Known Allergies  Review of Systems  Constitutional: Negative.   HENT: Negative.    Eyes:  Negative.   Respiratory: Negative.    Cardiovascular: Negative.   Gastrointestinal: Negative.   Genitourinary: Negative.   Musculoskeletal: Negative.   Skin: Negative.   Neurological: Negative.   Endo/Heme/Allergies: Negative.   Psychiatric/Behavioral: Negative.         Objective:   BP (!) 140/80   Pulse 74   Ht 6' (1.829 m)   Wt 246 lb 9.6 oz (111.9 kg)   SpO2 95%   BMI 33.44 kg/m   Vitals:   05/27/23 1332  BP: (!) 140/80  Pulse: 74  Height: 6' (1.829 m)  Weight: 246 lb 9.6 oz (111.9 kg)  SpO2: 95%  BMI (Calculated): 33.44    Physical Exam Vitals and nursing note reviewed.  Constitutional:      Appearance: Normal appearance.  HENT:     Head: Normocephalic.     Nose: Nose normal.     Mouth/Throat:     Mouth: Mucous membranes are moist.  Eyes:     Pupils: Pupils are equal, round, and reactive to light.  Cardiovascular:     Rate and Rhythm: Normal rate and regular rhythm.  Pulmonary:     Effort: Pulmonary effort is normal.     Breath sounds: Normal breath sounds.  Abdominal:  General: Bowel sounds are normal.     Palpations: Abdomen is soft.  Musculoskeletal:        General: Normal range of motion.     Cervical back: Normal range of motion and neck supple.  Skin:    General: Skin is warm and dry.  Neurological:     Mental Status: He is alert and oriented to person, place, and time.  Psychiatric:        Mood and Affect: Mood normal.        Behavior: Behavior normal.      No results found for any visits on 05/27/23.  Recent Results (from the past 2160 hour(s))  Hemoglobin A1c     Status: Abnormal   Collection Time: 05/13/23  9:20 AM  Result Value Ref Range   Hgb A1c MFr Bld 6.8 (H) 4.8 - 5.6 %    Comment:          Prediabetes: 5.7 - 6.4          Diabetes: >6.4          Glycemic control for adults with diabetes: <7.0    Est. average glucose Bld gHb Est-mCnc 148 mg/dL  TSH     Status: None   Collection Time: 05/13/23  9:20 AM  Result Value  Ref Range   TSH 1.390 0.450 - 4.500 uIU/mL  CMP14+EGFR     Status: Abnormal   Collection Time: 05/13/23  9:20 AM  Result Value Ref Range   Glucose 142 (H) 70 - 99 mg/dL   BUN 21 8 - 27 mg/dL   Creatinine, Ser 1.61 0.76 - 1.27 mg/dL   eGFR 75 >09 UE/AVW/0.98   BUN/Creatinine Ratio 19 10 - 24   Sodium 141 134 - 144 mmol/L   Potassium 4.2 3.5 - 5.2 mmol/L   Chloride 103 96 - 106 mmol/L   CO2 19 (L) 20 - 29 mmol/L   Calcium 8.9 8.6 - 10.2 mg/dL   Total Protein 6.8 6.0 - 8.5 g/dL   Albumin 4.2 3.9 - 4.9 g/dL   Globulin, Total 2.6 1.5 - 4.5 g/dL   Albumin/Globulin Ratio 1.6 1.2 - 2.2   Bilirubin Total 0.7 0.0 - 1.2 mg/dL   Alkaline Phosphatase 63 44 - 121 IU/L   AST 14 0 - 40 IU/L   ALT 13 0 - 44 IU/L  Lipid panel     Status: Abnormal   Collection Time: 05/13/23  9:20 AM  Result Value Ref Range   Cholesterol, Total 82 (L) 100 - 199 mg/dL   Triglycerides 119 0 - 149 mg/dL   HDL 29 (L) >14 mg/dL   VLDL Cholesterol Cal 21 5 - 40 mg/dL   LDL Chol Calc (NIH) 32 0 - 99 mg/dL   Chol/HDL Ratio 2.8 0.0 - 5.0 ratio    Comment:                                   T. Chol/HDL Ratio                                             Men  Women                               1/2 Avg.Risk  3.4    3.3                                   Avg.Risk  5.0    4.4                                2X Avg.Risk  9.6    7.1                                3X Avg.Risk 23.4   11.0       Assessment & Plan:  1) Follow up appt in 4 months, fasting labs prior Problem List Items Addressed This Visit       Endocrine   Diabetes mellitus without complication (HCC) - Primary     Other   Mixed hyperlipidemia   Overweight    Return in about 4 months (around 09/27/2023).   Total time spent: 35 minutes  Orson Eva, NP  05/27/2023   This document may have been prepared by [facility] Voice Recognition software and as such may include unintentional dictation errors.

## 2023-06-17 ENCOUNTER — Other Ambulatory Visit: Payer: Self-pay | Admitting: Nurse Practitioner

## 2023-06-17 DIAGNOSIS — E119 Type 2 diabetes mellitus without complications: Secondary | ICD-10-CM

## 2023-08-19 ENCOUNTER — Telehealth: Payer: Self-pay

## 2023-08-19 ENCOUNTER — Other Ambulatory Visit: Payer: Self-pay

## 2023-08-20 ENCOUNTER — Other Ambulatory Visit: Payer: Self-pay

## 2023-08-20 MED ORDER — FARXIGA 10 MG PO TABS: 10.0000 mg | ORAL_TABLET | Freq: Every morning | ORAL | 0 refills | Status: DC

## 2023-08-20 MED ORDER — TAMSULOSIN HCL 0.4 MG PO CAPS: 0.4000 mg | ORAL_CAPSULE | Freq: Every day | ORAL | 12 refills | Status: DC

## 2023-08-20 NOTE — Telephone Encounter (Signed)
Spoke with pharmacy and was able to get Rx filled.

## 2023-09-16 ENCOUNTER — Other Ambulatory Visit: Payer: Medicare Other

## 2023-09-16 DIAGNOSIS — E782 Mixed hyperlipidemia: Secondary | ICD-10-CM

## 2023-09-16 DIAGNOSIS — E663 Overweight: Secondary | ICD-10-CM

## 2023-09-16 DIAGNOSIS — E119 Type 2 diabetes mellitus without complications: Secondary | ICD-10-CM

## 2023-09-16 DIAGNOSIS — I10 Essential (primary) hypertension: Secondary | ICD-10-CM

## 2023-09-17 ENCOUNTER — Other Ambulatory Visit: Payer: Self-pay | Admitting: Cardiology

## 2023-09-17 LAB — LIPID PANEL
Chol/HDL Ratio: 3.5 ratio (ref 0.0–5.0)
Cholesterol, Total: 102 mg/dL (ref 100–199)
HDL: 29 mg/dL — ABNORMAL LOW (ref >39)
LDL Chol Calc (NIH): 44 mg/dL (ref 0–99)
Triglycerides: 176 mg/dL — ABNORMAL HIGH (ref 0–149)
VLDL Cholesterol Cal: 29 mg/dL (ref 5–40)

## 2023-09-17 LAB — CBC WITH DIFFERENTIAL/PLATELET
Basophils Absolute: 0 10*3/uL (ref 0.0–0.2)
Basos: 1 %
EOS (ABSOLUTE): 0.2 10*3/uL (ref 0.0–0.4)
Eos: 3 %
Hematocrit: 44.4 % (ref 37.5–51.0)
Hemoglobin: 14.1 g/dL (ref 13.0–17.7)
Immature Grans (Abs): 0 10*3/uL (ref 0.0–0.1)
Immature Granulocytes: 0 %
Lymphocytes Absolute: 1.5 10*3/uL (ref 0.7–3.1)
Lymphs: 25 %
MCH: 30.5 pg (ref 26.6–33.0)
MCHC: 31.8 g/dL (ref 31.5–35.7)
MCV: 96 fL (ref 79–97)
Monocytes Absolute: 0.5 10*3/uL (ref 0.1–0.9)
Monocytes: 9 %
Neutrophils Absolute: 3.7 10*3/uL (ref 1.4–7.0)
Neutrophils: 62 %
Platelets: 159 10*3/uL (ref 150–450)
RBC: 4.62 x10E6/uL (ref 4.14–5.80)
RDW: 13.1 % (ref 11.6–15.4)
WBC: 6 10*3/uL (ref 3.4–10.8)

## 2023-09-17 LAB — CMP14+EGFR
ALT: 14 IU/L (ref 0–44)
AST: 11 IU/L (ref 0–40)
Albumin: 4.6 g/dL (ref 3.9–4.9)
Alkaline Phosphatase: 74 IU/L (ref 44–121)
BUN/Creatinine Ratio: 27 — ABNORMAL HIGH (ref 10–24)
BUN: 28 mg/dL — ABNORMAL HIGH (ref 8–27)
Bilirubin Total: 0.8 mg/dL (ref 0.0–1.2)
CO2: 25 mmol/L (ref 20–29)
Calcium: 9.6 mg/dL (ref 8.6–10.2)
Chloride: 102 mmol/L (ref 96–106)
Creatinine, Ser: 1.04 mg/dL (ref 0.76–1.27)
Globulin, Total: 2.6 g/dL (ref 1.5–4.5)
Glucose: 240 mg/dL — ABNORMAL HIGH (ref 70–99)
Potassium: 5.2 mmol/L (ref 3.5–5.2)
Sodium: 137 mmol/L (ref 134–144)
Total Protein: 7.2 g/dL (ref 6.0–8.5)
eGFR: 78 mL/min/{1.73_m2} (ref >59)

## 2023-09-17 LAB — HEMOGLOBIN A1C
Est. average glucose Bld gHb Est-mCnc: 197 mg/dL
Hgb A1c MFr Bld: 8.5 % — ABNORMAL HIGH (ref 4.8–5.6)

## 2023-09-17 LAB — TSH: TSH: 1.8 u[IU]/mL (ref 0.450–4.500)

## 2023-09-20 ENCOUNTER — Encounter: Payer: Self-pay | Admitting: Cardiology

## 2023-09-20 ENCOUNTER — Ambulatory Visit (INDEPENDENT_AMBULATORY_CARE_PROVIDER_SITE_OTHER): Payer: Medicare Other | Admitting: Cardiology

## 2023-09-20 VITALS — BP 122/80 | HR 65 | Ht 72.0 in | Wt 252.0 lb

## 2023-09-20 DIAGNOSIS — E782 Mixed hyperlipidemia: Secondary | ICD-10-CM | POA: Diagnosis not present

## 2023-09-20 DIAGNOSIS — I10 Essential (primary) hypertension: Secondary | ICD-10-CM | POA: Insufficient documentation

## 2023-09-20 DIAGNOSIS — Z1211 Encounter for screening for malignant neoplasm of colon: Secondary | ICD-10-CM | POA: Diagnosis not present

## 2023-09-20 DIAGNOSIS — E119 Type 2 diabetes mellitus without complications: Secondary | ICD-10-CM

## 2023-09-20 LAB — POCT CBG (FASTING - GLUCOSE)-MANUAL ENTRY: Glucose Fasting, POC: 143 mg/dL — AB (ref 70–99)

## 2023-09-20 MED ORDER — METOPROLOL SUCCINATE ER 50 MG PO TB24: 50.0000 mg | ORAL_TABLET | Freq: Every day | ORAL | 3 refills | Status: DC

## 2023-09-20 MED ORDER — ROSUVASTATIN CALCIUM 20 MG PO TABS: 20.0000 mg | ORAL_TABLET | Freq: Every day | ORAL | 3 refills | Status: DC

## 2023-09-20 MED ORDER — METFORMIN HCL 1000 MG PO TABS: 1000.0000 mg | ORAL_TABLET | Freq: Two times a day (BID) | ORAL | 3 refills | Status: DC

## 2023-09-20 MED ORDER — LOSARTAN POTASSIUM 25 MG PO TABS: 25.0000 mg | ORAL_TABLET | Freq: Every day | ORAL | 3 refills | Status: DC

## 2023-09-20 MED ORDER — GLIPIZIDE ER 5 MG PO TB24: 5.0000 mg | ORAL_TABLET | Freq: Every day | ORAL | 3 refills | Status: DC

## 2023-09-20 MED ORDER — FARXIGA 10 MG PO TABS: 10.0000 mg | ORAL_TABLET | Freq: Every morning | ORAL | 11 refills | Status: DC

## 2023-09-20 NOTE — Progress Notes (Signed)
Established Patient Office Visit  Subjective:  Patient ID: Nathan Skates., male    DOB: 05-09-1956  Age: 67 y.o. MRN: [id number]  Chief Complaint  Patient presents with   Follow-up    4 month follow up    Patient in office for 4 month follow up, discuss recent lab work. Patient reports feeling well, no complaints today.  Due for colon cancer screening, will send order today for Cologuard.  Discussed recent lab work. Patient states he was out of [place] for 12 days in July. Hgb A1c highest it has ever been. Will repeat in 3 months.     No other concerns at this time.   Past Medical History:  Diagnosis Date   Diabetes mellitus without complication (HCC)    Irregular heart beat     Past Surgical History:  Procedure Laterality Date   EXTRACORPOREAL SHOCK WAVE LITHOTRIPSY Left 12/12/2017   Procedure: EXTRACORPOREAL SHOCK WAVE LITHOTRIPSY (ESWL);  Surgeon: Riki Altes, MD;  Location: [facility] ORS;  Service: Urology;  Laterality: Left;    Social History   Socioeconomic History   Marital status: Married    Spouse name: Not on file   Number of children: Not on file   Years of education: Not on file   Highest education level: Not on file  Occupational History   Not on file  Tobacco Use   Smoking status: Never   Smokeless tobacco: Never  Vaping Use   Vaping status: Never Used  Substance and Sexual Activity   Alcohol use: No   Drug use: No   Sexual activity: Never  Other Topics Concern   Not on file  Social History Narrative   Not on file   Social Determinants of Health   Financial Resource Strain: Not on file  Food Insecurity: Not on file  Transportation Needs: Not on file  Physical Activity: Not on file  Stress: Not on file  Social Connections: Not on file  Intimate Partner Violence: Not on file    Family History  Problem Relation Age of Onset   Diabetes Father    Prostate cancer Neg Hx    Bladder Cancer Neg Hx    Kidney cancer Neg Hx     No  Known Allergies  Review of Systems  Constitutional: Negative.   HENT: Negative.    Eyes: Negative.   Respiratory: Negative.  Negative for shortness of breath.   Cardiovascular: Negative.  Negative for chest pain.  Gastrointestinal: Negative.  Negative for abdominal pain, constipation and diarrhea.  Genitourinary: Negative.   Musculoskeletal:  Negative for joint pain and myalgias.  Skin: Negative.   Neurological: Negative.  Negative for dizziness and headaches.  Endo/Heme/Allergies: Negative.   All other systems reviewed and are negative.      Objective:   BP 122/80   Pulse 65   Ht 6' (1.829 m)   Wt 252 lb (114.3 kg)   SpO2 96%   BMI 34.18 kg/m   Vitals:   09/20/23 1354  BP: 122/80  Pulse: 65  Height: 6' (1.829 m)  Weight: 252 lb (114.3 kg)  SpO2: 96%  BMI (Calculated): 34.17    Physical Exam Nursing note reviewed.  Constitutional:      Appearance: Normal appearance. He is normal weight.  HENT:     Head: Normocephalic and atraumatic.     Nose: Nose normal.     Mouth/Throat:     Mouth: Mucous membranes are moist.     Pharynx: Oropharynx is clear.  Eyes:     Extraocular Movements: Extraocular movements intact.     Conjunctiva/sclera: Conjunctivae normal.     Pupils: Pupils are equal, round, and reactive to light.  Cardiovascular:     Rate and Rhythm: Normal rate and regular rhythm.     Pulses: Normal pulses.     Heart sounds: Normal heart sounds.  Pulmonary:     Effort: Pulmonary effort is normal.     Breath sounds: Normal breath sounds.  Abdominal:     General: Abdomen is flat. Bowel sounds are normal.     Palpations: Abdomen is soft.  Musculoskeletal:        General: Normal range of motion.     Cervical back: Normal range of motion.  Skin:    General: Skin is warm and dry.  Neurological:     General: No focal deficit present.     Mental Status: He is alert and oriented to person, place, and time.  Psychiatric:        Mood and Affect: Mood normal.         Behavior: Behavior normal.        Thought Content: Thought content normal.        Judgment: Judgment normal.      No results found for any visits on 09/20/23.  Recent Results (from the past 2160 hour(s))  Hemoglobin A1c     Status: Abnormal   Collection Time: 09/16/23  9:06 AM  Result Value Ref Range   Hgb A1c MFr Bld 8.5 (H) 4.8 - 5.6 %    Comment:          Prediabetes: 5.7 - 6.4          Diabetes: >6.4          Glycemic control for adults with diabetes: <7.0    Est. average glucose Bld gHb Est-mCnc 197 mg/dL  TSH     Status: None   Collection Time: 09/16/23  9:06 AM  Result Value Ref Range   TSH 1.800 0.450 - 4.500 uIU/mL  CMP14+EGFR     Status: Abnormal   Collection Time: 09/16/23  9:06 AM  Result Value Ref Range   Glucose 240 (H) 70 - 99 mg/dL   BUN 28 (H) 8 - 27 mg/dL   Creatinine, Ser 7.82 0.76 - 1.27 mg/dL   eGFR 78 >95 AO/ZHY/8.65   BUN/Creatinine Ratio 27 (H) 10 - 24   Sodium 137 134 - 144 mmol/L   Potassium 5.2 3.5 - 5.2 mmol/L   Chloride 102 96 - 106 mmol/L   CO2 25 20 - 29 mmol/L   Calcium 9.6 8.6 - 10.2 mg/dL   Total Protein 7.2 6.0 - 8.5 g/dL   Albumin 4.6 3.9 - 4.9 g/dL   Globulin, Total 2.6 1.5 - 4.5 g/dL   Bilirubin Total 0.8 0.0 - 1.2 mg/dL   Alkaline Phosphatase 74 44 - 121 IU/L   AST 11 0 - 40 IU/L   ALT 14 0 - 44 IU/L  Lipid panel     Status: Abnormal   Collection Time: 09/16/23  9:06 AM  Result Value Ref Range   Cholesterol, Total 102 100 - 199 mg/dL   Triglycerides 784 (H) 0 - 149 mg/dL   HDL 29 (L) >69 mg/dL   VLDL Cholesterol Cal 29 5 - 40 mg/dL   LDL Chol Calc (NIH) 44 0 - 99 mg/dL   Chol/HDL Ratio 3.5 0.0 - 5.0 ratio    Comment:  T. Chol/HDL Ratio                                             Men  Women                               1/2 Avg.Risk  3.4    3.3                                   Avg.Risk  5.0    4.4                                2X Avg.Risk  9.6    7.1                                 3X Avg.Risk 23.4   11.0   CBC with Differential/Platelet     Status: None   Collection Time: 09/16/23  9:06 AM  Result Value Ref Range   WBC 6.0 3.4 - 10.8 x10E3/uL   RBC 4.62 4.14 - 5.80 x10E6/uL   Hemoglobin 14.1 13.0 - 17.7 g/dL   Hematocrit 16.1 09.6 - 51.0 %   MCV 96 79 - 97 fL   MCH 30.5 26.6 - 33.0 pg   MCHC 31.8 31.5 - 35.7 g/dL   RDW 04.5 40.9 - 81.1 %   Platelets 159 150 - 450 x10E3/uL   Neutrophils 62 Not Estab. %   Lymphs 25 Not Estab. %   Monocytes 9 Not Estab. %   Eos 3 Not Estab. %   Basos 1 Not Estab. %   Neutrophils Absolute 3.7 1.4 - 7.0 x10E3/uL   Lymphocytes Absolute 1.5 0.7 - 3.1 x10E3/uL   Monocytes Absolute 0.5 0.1 - 0.9 x10E3/uL   EOS (ABSOLUTE) 0.2 0.0 - 0.4 x10E3/uL   Basophils Absolute 0.0 0.0 - 0.2 x10E3/uL   Immature Granulocytes 0 Not Estab. %   Immature Grans (Abs) 0.0 0.0 - 0.1 x10E3/uL      Assessment & Plan:  Continue all medications.  Continue diabetic diet and exercise.  Cologuard order sent.   Problem List Items Addressed This Visit       Cardiovascular and Mediastinum   Essential hypertension, benign   Relevant Medications   losartan (COZAAR) 25 MG tablet   metoprolol succinate (TOPROL-XL) 50 MG 24 hr tablet   rosuvastatin (CRESTOR) 20 MG tablet     Endocrine   Diabetes mellitus without complication (HCC) - Primary   Relevant Medications   FARXIGA 10 MG TABS tablet   glipiZIDE (GLUCOTROL XL) 5 MG 24 hr tablet   losartan (COZAAR) 25 MG tablet   metFORMIN (GLUCOPHAGE) 1000 MG tablet   rosuvastatin (CRESTOR) 20 MG tablet   Other Relevant Orders   POCT CBG (Fasting - Glucose)   POCT glucose (manual entry)     Other   Mixed hyperlipidemia   Relevant Medications   losartan (COZAAR) 25 MG tablet   metoprolol succinate (TOPROL-XL) 50 MG 24 hr tablet   rosuvastatin (CRESTOR) 20 MG tablet   Other Visit Diagnoses     Colon cancer screening  Relevant Orders   Cologuard   Type 2 diabetes mellitus without complications  (HCC)       Relevant Medications   FARXIGA 10 MG TABS tablet   glipiZIDE (GLUCOTROL XL) 5 MG 24 hr tablet   losartan (COZAAR) 25 MG tablet   metFORMIN (GLUCOPHAGE) 1000 MG tablet   rosuvastatin (CRESTOR) 20 MG tablet       Return in about 4 months (around 01/20/2024) for with fasting labs prior.   Total time spent: 25 minutes  Google, NP  09/20/2023   This document may have been prepared by Dragon Voice Recognition software and as such may include unintentional dictation errors.

## 2023-10-13 LAB — COLOGUARD: COLOGUARD: NEGATIVE

## 2023-10-14 NOTE — Progress Notes (Signed)
DOB verified and verbalized understanding.

## 2024-01-27 ENCOUNTER — Other Ambulatory Visit: Payer: Medicare Other

## 2024-01-27 DIAGNOSIS — E663 Overweight: Secondary | ICD-10-CM

## 2024-01-27 DIAGNOSIS — I10 Essential (primary) hypertension: Secondary | ICD-10-CM

## 2024-01-27 DIAGNOSIS — E119 Type 2 diabetes mellitus without complications: Secondary | ICD-10-CM

## 2024-01-27 DIAGNOSIS — E782 Mixed hyperlipidemia: Secondary | ICD-10-CM

## 2024-01-28 LAB — CMP14+EGFR
ALT: 17 [IU]/L (ref 0–44)
AST: 15 [IU]/L (ref 0–40)
Albumin: 4.1 g/dL (ref 3.9–4.9)
Alkaline Phosphatase: 79 [IU]/L (ref 44–121)
BUN/Creatinine Ratio: 20 (ref 10–24)
BUN: 21 mg/dL (ref 8–27)
Bilirubin Total: 0.8 mg/dL (ref 0.0–1.2)
CO2: 24 mmol/L (ref 20–29)
Calcium: 9.2 mg/dL (ref 8.6–10.2)
Chloride: 102 mmol/L (ref 96–106)
Creatinine, Ser: 1.05 mg/dL (ref 0.76–1.27)
Globulin, Total: 2.7 g/dL (ref 1.5–4.5)
Glucose: 150 mg/dL — ABNORMAL HIGH (ref 70–99)
Potassium: 4.8 mmol/L (ref 3.5–5.2)
Sodium: 139 mmol/L (ref 134–144)
Total Protein: 6.8 g/dL (ref 6.0–8.5)
eGFR: 77 mL/min/{1.73_m2} (ref >59)

## 2024-01-28 LAB — LIPID PANEL
Chol/HDL Ratio: 3.5 {ratio} (ref 0.0–5.0)
Cholesterol, Total: 81 mg/dL — ABNORMAL LOW (ref 100–199)
HDL: 23 mg/dL — ABNORMAL LOW (ref >39)
LDL Chol Calc (NIH): 37 mg/dL (ref 0–99)
Triglycerides: 110 mg/dL (ref 0–149)
VLDL Cholesterol Cal: 21 mg/dL (ref 5–40)

## 2024-01-28 LAB — HEMOGLOBIN A1C
Est. average glucose Bld gHb Est-mCnc: 160 mg/dL
Hgb A1c MFr Bld: 7.2 % — ABNORMAL HIGH (ref 4.8–5.6)

## 2024-01-28 LAB — TSH: TSH: 1.25 u[IU]/mL (ref 0.450–4.500)

## 2024-01-31 ENCOUNTER — Encounter: Payer: Self-pay | Admitting: Cardiology

## 2024-01-31 ENCOUNTER — Ambulatory Visit: Payer: Medicare Other | Admitting: Cardiology

## 2024-01-31 VITALS — BP 130/81 | HR 50 | Ht 72.0 in | Wt 246.0 lb

## 2024-01-31 DIAGNOSIS — I10 Essential (primary) hypertension: Secondary | ICD-10-CM

## 2024-01-31 DIAGNOSIS — E782 Mixed hyperlipidemia: Secondary | ICD-10-CM

## 2024-01-31 DIAGNOSIS — E119 Type 2 diabetes mellitus without complications: Secondary | ICD-10-CM | POA: Diagnosis not present

## 2024-01-31 LAB — POCT UA - MICROALBUMIN
Creatinine, POC: 50 mg/dL
Microalbumin Ur, POC: 30 mg/L

## 2024-01-31 MED ORDER — FARXIGA 10 MG PO TABS: 10.0000 mg | ORAL_TABLET | Freq: Every morning | ORAL | 11 refills | Status: AC

## 2024-01-31 NOTE — Progress Notes (Signed)
Established Patient Office Visit  Subjective:  Patient ID: Nathan Hansen., male    DOB: [birth year]  Age: 68 y.o. MRN: [id number]  No chief complaint on file.   Patient in office for 4 month follow up, discuss recent lab results.Patient reports feeling well, no complaints today. Discussed recent labs, LDL and Hgb A1c at goal. Will check urine today.     No other concerns at this time.   Past Medical History:  Diagnosis Date   Diabetes mellitus without complication (HCC)    Irregular heart beat     Past Surgical History:  Procedure Laterality Date   EXTRACORPOREAL SHOCK WAVE LITHOTRIPSY Left 12/12/2017   Procedure: EXTRACORPOREAL SHOCK WAVE LITHOTRIPSY (ESWL);  Surgeon: Riki Altes, MD;  Location: [facility];  Service: Urology;  Laterality: Left;    Social History   Socioeconomic History   Marital status: Married    Spouse name: Not on file   Number of children: Not on file   Years of education: Not on file   Highest education level: Not on file  Occupational History   Not on file  Tobacco Use   Smoking status: Never   Smokeless tobacco: Never  Vaping Use   Vaping status: Never Used  Substance and Sexual Activity   Alcohol use: No   Drug use: No   Sexual activity: Never  Other Topics Concern   Not on file  Social History Narrative   Not on file   Social Drivers of Health   Financial Resource Strain: Not on file  Food Insecurity: Not on file  Transportation Needs: Not on file  Physical Activity: Not on file  Stress: Not on file  Social Connections: Not on file  Intimate Partner Violence: Not on file    Family History  Problem Relation Age of Onset   Diabetes Father    Prostate cancer Neg Hx    Bladder Cancer Neg Hx    Kidney cancer Neg Hx     No Known Allergies  Outpatient Medications Prior to Visit  Medication Sig   brimonidine (ALPHAGAN) 0.2 % ophthalmic solution    EASY TOUCH TEST test strip USE TO CHECK FASTING MORNING BLOOD SUGAR  ONCE DAILY   fluorometholone (FML) 0.1 % ophthalmic suspension    glipiZIDE (GLUCOTROL XL) 5 MG 24 hr tablet Take 1 tablet (5 mg total) by mouth daily with breakfast.   losartan (COZAAR) 25 MG tablet Take 1 tablet (25 mg total) by mouth daily.   metFORMIN (GLUCOPHAGE) 1000 MG tablet Take 1 tablet (1,000 mg total) by mouth 2 (two) times daily.   metoprolol succinate (TOPROL-XL) 50 MG 24 hr tablet Take 1 tablet (50 mg total) by mouth daily.   mupirocin ointment (BACTROBAN) 2 % Apply 1 application topically 3 (three) times daily.   rosuvastatin (CRESTOR) 20 MG tablet Take 1 tablet (20 mg total) by mouth daily.   tamsulosin (FLOMAX) 0.4 MG CAPS capsule Take 1 capsule (0.4 mg total) by mouth daily.   [DISCONTINUED] FARXIGA 10 MG TABS tablet Take 1 tablet (10 mg total) by mouth every morning.   No facility-administered medications prior to visit.    Review of Systems  Constitutional: Negative.   HENT: Negative.    Eyes: Negative.   Respiratory: Negative.  Negative for shortness of breath.   Cardiovascular: Negative.  Negative for chest pain.  Gastrointestinal: Negative.  Negative for abdominal pain, constipation and diarrhea.  Genitourinary: Negative.   Musculoskeletal:  Negative for joint pain and myalgias.  Skin: Negative.   Neurological: Negative.  Negative for dizziness and headaches.  Endo/Heme/Allergies: Negative.   All other systems reviewed and are negative.      Objective:   BP 130/81   Pulse (!) 50   Ht 6' (1.829 m)   Wt 246 lb (111.6 kg)   SpO2 97%   BMI 33.36 kg/m   Vitals:   01/31/24 1324  BP: 130/81  Pulse: (!) 50  Height: 6' (1.829 m)  Weight: 246 lb (111.6 kg)  SpO2: 97%  BMI (Calculated): 33.36    Physical Exam Nursing note reviewed.  Constitutional:      Appearance: Normal appearance. He is normal weight.  HENT:     Head: Normocephalic and atraumatic.     Nose: Nose normal.     Mouth/Throat:     Mouth: Mucous membranes are moist.     Pharynx:  Oropharynx is clear.  Eyes:     Extraocular Movements: Extraocular movements intact.     Conjunctiva/sclera: Conjunctivae normal.     Pupils: Pupils are equal, round, and reactive to light.  Cardiovascular:     Rate and Rhythm: Normal rate and regular rhythm.     Pulses: Normal pulses.     Heart sounds: Normal heart sounds.  Pulmonary:     Effort: Pulmonary effort is normal.     Breath sounds: Normal breath sounds.  Abdominal:     General: Abdomen is flat. Bowel sounds are normal.     Palpations: Abdomen is soft.  Musculoskeletal:        General: Normal range of motion.     Cervical back: Normal range of motion.  Skin:    General: Skin is warm and dry.  Neurological:     General: No focal deficit present.     Mental Status: He is alert and oriented to person, place, and time.  Psychiatric:        Mood and Affect: Mood normal.        Behavior: Behavior normal.        Thought Content: Thought content normal.        Judgment: Judgment normal.      No results found for any visits on 01/31/24.  Recent Results (from the past 2160 hours)  Hemoglobin A1c     Status: Abnormal   Collection Time: 01/27/24  8:29 AM  Result Value Ref Range   Hgb A1c MFr Bld 7.2 (H) 4.8 - 5.6 %    Comment:          Prediabetes: 5.7 - 6.4          Diabetes: >6.4          Glycemic control for adults with diabetes: <7.0    Est. average glucose Bld gHb Est-mCnc 160 mg/dL  TSH     Status: None   Collection Time: 01/27/24  8:29 AM  Result Value Ref Range   TSH 1.250 0.450 - 4.500 uIU/mL  CMP14+EGFR     Status: Abnormal   Collection Time: 01/27/24  8:29 AM  Result Value Ref Range   Glucose 150 (H) 70 - 99 mg/dL   BUN 21 8 - 27 mg/dL   Creatinine, Ser 1.61 0.76 - 1.27 mg/dL   eGFR 77 >09 UE/AVW/0.98   BUN/Creatinine Ratio 20 10 - 24   Sodium 139 134 - 144 mmol/L   Potassium 4.8 3.5 - 5.2 mmol/L   Chloride 102 96 - 106 mmol/L   CO2 24 20 - 29 mmol/L  Calcium 9.2 8.6 - 10.2 mg/dL   Total Protein  6.8 6.0 - 8.5 g/dL   Albumin 4.1 3.9 - 4.9 g/dL   Globulin, Total 2.7 1.5 - 4.5 g/dL   Bilirubin Total 0.8 0.0 - 1.2 mg/dL   Alkaline Phosphatase 79 44 - 121 IU/L   AST 15 0 - 40 IU/L   ALT 17 0 - 44 IU/L  Lipid panel     Status: Abnormal   Collection Time: 01/27/24  8:29 AM  Result Value Ref Range   Cholesterol, Total 81 (L) 100 - 199 mg/dL   Triglycerides 098 0 - 149 mg/dL   HDL 23 (L) >11 mg/dL   VLDL Cholesterol Cal 21 5 - 40 mg/dL   LDL Chol Calc (NIH) 37 0 - 99 mg/dL   Chol/HDL Ratio 3.5 0.0 - 5.0 ratio    Comment:                                   T. Chol/HDL Ratio                                             Men  Women                               1/2 Avg.Risk  3.4    3.3                                   Avg.Risk  5.0    4.4                                2X Avg.Risk  9.6    7.1                                3X Avg.Risk 23.4   11.0       Assessment & Plan:  Continue same medications.   Problem List Items Addressed This Visit       Cardiovascular and Mediastinum   Essential hypertension, benign - Primary     Endocrine   Type 2 diabetes mellitus without complications (HCC)   Relevant Medications   FARXIGA 10 MG TABS tablet   Other Relevant Orders   POCT Urine Albumin/Creatinine with ratio [[id number]]     Other   Mixed hyperlipidemia    Return in about 4 months (around 05/30/2024) for with fasting labs prior.   Total time spent: 25 minutes  Google, NP  01/31/2024   This document may have been prepared by Dragon Voice Recognition software and as such may include unintentional dictation errors.

## 2024-05-06 ENCOUNTER — Encounter: Payer: Self-pay | Admitting: Cardiology

## 2024-06-01 ENCOUNTER — Other Ambulatory Visit

## 2024-06-01 DIAGNOSIS — E663 Overweight: Secondary | ICD-10-CM

## 2024-06-01 DIAGNOSIS — E782 Mixed hyperlipidemia: Secondary | ICD-10-CM

## 2024-06-01 DIAGNOSIS — E119 Type 2 diabetes mellitus without complications: Secondary | ICD-10-CM

## 2024-06-01 DIAGNOSIS — I10 Essential (primary) hypertension: Secondary | ICD-10-CM

## 2024-06-02 ENCOUNTER — Ambulatory Visit: Payer: Self-pay | Admitting: Cardiology

## 2024-06-02 LAB — CMP14+EGFR
ALT: 19 IU/L (ref 0–44)
AST: 19 IU/L (ref 0–40)
Albumin: 4.4 g/dL (ref 3.9–4.9)
Alkaline Phosphatase: 67 IU/L (ref 44–121)
BUN/Creatinine Ratio: 19 (ref 10–24)
BUN: 20 mg/dL (ref 8–27)
Bilirubin Total: 1.1 mg/dL (ref 0.0–1.2)
CO2: 25 mmol/L (ref 20–29)
Calcium: 9.6 mg/dL (ref 8.6–10.2)
Chloride: 98 mmol/L (ref 96–106)
Creatinine, Ser: 1.04 mg/dL (ref 0.76–1.27)
Globulin, Total: 2.5 g/dL (ref 1.5–4.5)
Glucose: 212 mg/dL — ABNORMAL HIGH (ref 70–99)
Potassium: 4.6 mmol/L (ref 3.5–5.2)
Sodium: 134 mmol/L (ref 134–144)
Total Protein: 6.9 g/dL (ref 6.0–8.5)
eGFR: 78 mL/min/{1.73_m2} (ref >59)

## 2024-06-02 LAB — LIPID PANEL
Chol/HDL Ratio: 3 ratio (ref 0.0–5.0)
Cholesterol, Total: 84 mg/dL — ABNORMAL LOW (ref 100–199)
HDL: 28 mg/dL — ABNORMAL LOW (ref >39)
LDL Chol Calc (NIH): 32 mg/dL (ref 0–99)
Triglycerides: 136 mg/dL (ref 0–149)
VLDL Cholesterol Cal: 24 mg/dL (ref 5–40)

## 2024-06-02 LAB — HEMOGLOBIN A1C
Est. average glucose Bld gHb Est-mCnc: 174 mg/dL
Hgb A1c MFr Bld: 7.7 % — ABNORMAL HIGH (ref 4.8–5.6)

## 2024-06-02 LAB — TSH: TSH: 2.04 u[IU]/mL (ref 0.450–4.500)

## 2024-06-05 ENCOUNTER — Encounter: Payer: Self-pay | Admitting: Cardiology

## 2024-06-05 ENCOUNTER — Ambulatory Visit: Payer: Medicare Other | Admitting: Cardiology

## 2024-06-05 VITALS — BP 130/82 | HR 60 | Ht 72.0 in | Wt 249.2 lb

## 2024-06-05 DIAGNOSIS — I10 Essential (primary) hypertension: Secondary | ICD-10-CM | POA: Diagnosis not present

## 2024-06-05 DIAGNOSIS — E119 Type 2 diabetes mellitus without complications: Secondary | ICD-10-CM | POA: Diagnosis not present

## 2024-06-05 DIAGNOSIS — E782 Mixed hyperlipidemia: Secondary | ICD-10-CM

## 2024-06-05 NOTE — Progress Notes (Signed)
 Established Patient Office Visit  Subjective:  Patient ID: Nathan Chavarin., male    DOB: 10-17-1956  Age: 68 y.o. MRN: [id number]  Chief Complaint  Patient presents with   Follow-up    4 month lab results     Patient in office for 4 month follow up, discuss recent lab results. Patient doing well, no complaints today. Discussed recent lab work, Hgb A1c elevated, decrease carb and sugar intake.  Continue same medications. The patient is asked to make an attempt to improve diet and exercise patterns to aid in medical management of this problem.    No other concerns at this time.   Past Medical History:  Diagnosis Date   Diabetes mellitus without complication (HCC)    Irregular heart beat     Past Surgical History:  Procedure Laterality Date   EXTRACORPOREAL SHOCK WAVE LITHOTRIPSY Left 12/12/2017   Procedure: EXTRACORPOREAL SHOCK WAVE LITHOTRIPSY (ESWL);  Surgeon: Geraline Knapp, MD;  Location: [facility];  Service: Urology;  Laterality: Left;    Social History   Socioeconomic History   Marital status: Married    Spouse name: Not on file   Number of children: Not on file   Years of education: Not on file   Highest education level: Not on file  Occupational History   Not on file  Tobacco Use   Smoking status: Never   Smokeless tobacco: Never  Vaping Use   Vaping status: Never Used  Substance and Sexual Activity   Alcohol use: No   Drug use: No   Sexual activity: Never  Other Topics Concern   Not on file  Social History Narrative   Not on file   Social Drivers of Health   Financial Resource Strain: Not on file  Food Insecurity: Not on file  Transportation Needs: Not on file  Physical Activity: Not on file  Stress: Not on file  Social Connections: Not on file  Intimate Partner Violence: Not on file    Family History  Problem Relation Age of Onset   Diabetes Father    Prostate cancer Neg Hx    Bladder Cancer Neg Hx    Kidney cancer Neg Hx      No Known Allergies  Outpatient Medications Prior to Visit  Medication Sig   brimonidine (ALPHAGAN) 0.2 % ophthalmic solution    EASY TOUCH TEST test strip USE TO CHECK FASTING MORNING BLOOD SUGAR ONCE DAILY   FARXIGA  10 MG TABS tablet Take 1 tablet (10 mg total) by mouth every morning.   fluorometholone (FML) 0.1 % ophthalmic suspension    glipiZIDE  (GLUCOTROL  XL) 5 MG 24 hr tablet Take 1 tablet (5 mg total) by mouth daily with breakfast.   losartan  (COZAAR ) 25 MG tablet Take 1 tablet (25 mg total) by mouth daily.   metFORMIN  (GLUCOPHAGE ) 1000 MG tablet Take 1 tablet (1,000 mg total) by mouth 2 (two) times daily.   metoprolol  succinate (TOPROL -XL) 50 MG 24 hr tablet Take 1 tablet (50 mg total) by mouth daily.   mupirocin  ointment (BACTROBAN ) 2 % Apply 1 application topically 3 (three) times daily.   rosuvastatin  (CRESTOR ) 20 MG tablet Take 1 tablet (20 mg total) by mouth daily.   tamsulosin  (FLOMAX ) 0.4 MG CAPS capsule Take 1 capsule (0.4 mg total) by mouth daily.   No facility-administered medications prior to visit.    Review of Systems  Constitutional: Negative.   HENT: Negative.    Eyes: Negative.   Respiratory: Negative.  Negative for  shortness of breath.   Cardiovascular: Negative.  Negative for chest pain.  Gastrointestinal: Negative.  Negative for abdominal pain, constipation and diarrhea.  Genitourinary: Negative.   Musculoskeletal:  Negative for joint pain and myalgias.  Skin: Negative.   Neurological: Negative.  Negative for dizziness and headaches.  Endo/Heme/Allergies: Negative.   All other systems reviewed and are negative.      Objective:   BP 130/82   Pulse 60   Ht 6' (1.829 m)   Wt 249 lb 3.2 oz (113 kg)   SpO2 98%   BMI 33.80 kg/m   Vitals:   06/05/24 1347  BP: 130/82  Pulse: 60  Height: 6' (1.829 m)  Weight: 249 lb 3.2 oz (113 kg)  SpO2: 98%  BMI (Calculated): 33.79    Physical Exam Nursing note reviewed.  Constitutional:       Appearance: Normal appearance. He is normal weight.  HENT:     Head: Normocephalic and atraumatic.     Nose: Nose normal.     Mouth/Throat:     Mouth: Mucous membranes are moist.     Pharynx: Oropharynx is clear.  Eyes:     Extraocular Movements: Extraocular movements intact.     Conjunctiva/sclera: Conjunctivae normal.     Pupils: Pupils are equal, round, and reactive to light.  Cardiovascular:     Rate and Rhythm: Normal rate and regular rhythm.     Pulses: Normal pulses.     Heart sounds: Normal heart sounds.  Pulmonary:     Effort: Pulmonary effort is normal.     Breath sounds: Normal breath sounds.  Abdominal:     General: Abdomen is flat. Bowel sounds are normal.     Palpations: Abdomen is soft.  Musculoskeletal:        General: Normal range of motion.     Cervical back: Normal range of motion.  Skin:    General: Skin is warm and dry.  Neurological:     General: No focal deficit present.     Mental Status: He is alert and oriented to person, place, and time.  Psychiatric:        Mood and Affect: Mood normal.        Behavior: Behavior normal.        Thought Content: Thought content normal.        Judgment: Judgment normal.      No results found for any visits on 06/05/24.  Recent Results (from the past 2160 hours)  Hemoglobin A1c     Status: Abnormal   Collection Time: 06/01/24  8:10 AM  Result Value Ref Range   Hgb A1c MFr Bld 7.7 (H) 4.8 - 5.6 %    Comment:          Prediabetes: 5.7 - 6.4          Diabetes: >6.4          Glycemic control for adults with diabetes: <7.0    Est. average glucose Bld gHb Est-mCnc 174 mg/dL  TSH     Status: None   Collection Time: 06/01/24  8:10 AM  Result Value Ref Range   TSH 2.040 0.450 - 4.500 uIU/mL  CMP14+EGFR     Status: Abnormal   Collection Time: 06/01/24  8:10 AM  Result Value Ref Range   Glucose 212 (H) 70 - 99 mg/dL   BUN 20 8 - 27 mg/dL   Creatinine, Ser 1.61 0.76 - 1.27 mg/dL   eGFR 78 >09 UE/AVW/0.98  BUN/Creatinine Ratio 19 10 - 24   Sodium 134 134 - 144 mmol/L   Potassium 4.6 3.5 - 5.2 mmol/L   Chloride 98 96 - 106 mmol/L   CO2 25 20 - 29 mmol/L   Calcium  9.6 8.6 - 10.2 mg/dL   Total Protein 6.9 6.0 - 8.5 g/dL   Albumin 4.4 3.9 - 4.9 g/dL   Globulin, Total 2.5 1.5 - 4.5 g/dL   Bilirubin Total 1.1 0.0 - 1.2 mg/dL   Alkaline Phosphatase 67 44 - 121 IU/L   AST 19 0 - 40 IU/L   ALT 19 0 - 44 IU/L  Lipid panel     Status: Abnormal   Collection Time: 06/01/24  8:10 AM  Result Value Ref Range   Cholesterol, Total 84 (L) 100 - 199 mg/dL   Triglycerides 161 0 - 149 mg/dL   HDL 28 (L) >09 mg/dL   VLDL Cholesterol Cal 24 5 - 40 mg/dL   LDL Chol Calc (NIH) 32 0 - 99 mg/dL   Chol/HDL Ratio 3.0 0.0 - 5.0 ratio    Comment:                                   T. Chol/HDL Ratio                                             Men  Women                               1/2 Avg.Risk  3.4    3.3                                   Avg.Risk  5.0    4.4                                2X Avg.Risk  9.6    7.1                                3X Avg.Risk 23.4   11.0       Assessment & Plan:  Continue same medications. Follow a diabetic diet to lower A1c.  Problem List Items Addressed This Visit       Cardiovascular and Mediastinum   Essential hypertension, benign - Primary     Endocrine   Type 2 diabetes mellitus without complications (HCC)     Other   Mixed hyperlipidemia    Return in about 4 months (around 10/05/2024) for with fasting labs prior.   Total time spent: 25 minutes  Google, NP  06/05/2024   This document may have been prepared by Dragon Voice Recognition software and as such may include unintentional dictation errors.

## 2024-09-06 ENCOUNTER — Other Ambulatory Visit: Payer: Self-pay | Admitting: Cardiology

## 2024-10-05 ENCOUNTER — Other Ambulatory Visit

## 2024-10-05 DIAGNOSIS — I10 Essential (primary) hypertension: Secondary | ICD-10-CM

## 2024-10-05 DIAGNOSIS — E663 Overweight: Secondary | ICD-10-CM

## 2024-10-05 DIAGNOSIS — E119 Type 2 diabetes mellitus without complications: Secondary | ICD-10-CM

## 2024-10-05 DIAGNOSIS — E782 Mixed hyperlipidemia: Secondary | ICD-10-CM

## 2024-10-06 ENCOUNTER — Ambulatory Visit: Payer: Self-pay | Admitting: Cardiology

## 2024-10-06 LAB — CMP14+EGFR
ALT: 15 IU/L (ref 0–44)
AST: 17 IU/L (ref 0–40)
Albumin: 4.3 g/dL (ref 3.9–4.9)
Alkaline Phosphatase: 71 IU/L (ref 47–123)
BUN/Creatinine Ratio: 24 (ref 10–24)
BUN: 25 mg/dL (ref 8–27)
Bilirubin Total: 1.4 mg/dL — ABNORMAL HIGH (ref 0.0–1.2)
CO2: 24 mmol/L (ref 20–29)
Calcium: 9.1 mg/dL (ref 8.6–10.2)
Chloride: 101 mmol/L (ref 96–106)
Creatinine, Ser: 1.06 mg/dL (ref 0.76–1.27)
Globulin, Total: 2.3 g/dL (ref 1.5–4.5)
Glucose: 191 mg/dL — ABNORMAL HIGH (ref 70–99)
Potassium: 4.4 mmol/L (ref 3.5–5.2)
Sodium: 137 mmol/L (ref 134–144)
Total Protein: 6.6 g/dL (ref 6.0–8.5)
eGFR: 75 mL/min/1.73 (ref >59)

## 2024-10-06 LAB — LIPID PANEL
Chol/HDL Ratio: 3 ratio (ref 0.0–5.0)
Cholesterol, Total: 78 mg/dL — ABNORMAL LOW (ref 100–199)
HDL: 26 mg/dL — ABNORMAL LOW (ref >39)
LDL Chol Calc (NIH): 28 mg/dL (ref 0–99)
Triglycerides: 136 mg/dL (ref 0–149)
VLDL Cholesterol Cal: 24 mg/dL (ref 5–40)

## 2024-10-06 LAB — HEMOGLOBIN A1C
Est. average glucose Bld gHb Est-mCnc: 163 mg/dL
Hgb A1c MFr Bld: 7.3 % — ABNORMAL HIGH (ref 4.8–5.6)

## 2024-10-06 LAB — TSH: TSH: 1.51 u[IU]/mL (ref 0.450–4.500)

## 2024-10-09 ENCOUNTER — Encounter: Payer: Self-pay | Admitting: Cardiology

## 2024-10-09 ENCOUNTER — Ambulatory Visit: Admitting: Cardiology

## 2024-10-09 VITALS — BP 122/66 | HR 57 | Ht 72.0 in | Wt 245.4 lb

## 2024-10-09 DIAGNOSIS — E119 Type 2 diabetes mellitus without complications: Secondary | ICD-10-CM | POA: Diagnosis not present

## 2024-10-09 DIAGNOSIS — E782 Mixed hyperlipidemia: Secondary | ICD-10-CM

## 2024-10-09 DIAGNOSIS — I10 Essential (primary) hypertension: Secondary | ICD-10-CM

## 2024-10-09 NOTE — Progress Notes (Signed)
 Established Patient Office Visit  Subjective:  Patient ID: Nathan Hansen., male    DOB: August 25, 1956  Age: 68 y.o. MRN: [id number]  Chief Complaint  Patient presents with   Follow-up    4 month lab results    Patient in office for 4 month follow up, discuss recent lab results. Patient doing well, no complaints today. Discussed recent lab work. Hgb A1c improved, continue to work on diet and exercise to lower A1c. LDL at goal.  Offered flu vaccination, patient declined.  Continue same medications.     No other concerns at this time.   Past Medical History:  Diagnosis Date   Diabetes mellitus without complication (HCC)    Irregular heart beat     Past Surgical History:  Procedure Laterality Date   EXTRACORPOREAL SHOCK WAVE LITHOTRIPSY Left 12/12/2017   Procedure: EXTRACORPOREAL SHOCK WAVE LITHOTRIPSY (ESWL);  Surgeon: Twylla Glendia JAYSON, MD;  Location: [facility];  Service: Urology;  Laterality: Left;    Social History   Socioeconomic History   Marital status: Married    Spouse name: Not on file   Number of children: Not on file   Years of education: Not on file   Highest education level: Not on file  Occupational History   Not on file  Tobacco Use   Smoking status: Never   Smokeless tobacco: Never  Vaping Use   Vaping status: Never Used  Substance and Sexual Activity   Alcohol use: No   Drug use: No   Sexual activity: Never  Other Topics Concern   Not on file  Social History Narrative   Not on file   Social Drivers of Health   Financial Resource Strain: Not on file  Food Insecurity: Not on file  Transportation Needs: Not on file  Physical Activity: Not on file  Stress: Not on file  Social Connections: Not on file  Intimate Partner Violence: Not on file    Family History  Problem Relation Age of Onset   Diabetes Father    Prostate cancer Neg Hx    Bladder Cancer Neg Hx    Kidney cancer Neg Hx     No Known Allergies  Outpatient Medications  Prior to Visit  Medication Sig   brimonidine (ALPHAGAN) 0.2 % ophthalmic solution    EASY TOUCH TEST test strip USE TO CHECK FASTING MORNING BLOOD SUGAR ONCE DAILY   FARXIGA  10 MG TABS tablet Take 1 tablet (10 mg total) by mouth every morning.   fluorometholone (FML) 0.1 % ophthalmic suspension    glipiZIDE  (GLUCOTROL  XL) 5 MG 24 hr tablet Take 1 tablet (5 mg total) by mouth daily with breakfast.   losartan  (COZAAR ) 25 MG tablet Take 1 tablet (25 mg total) by mouth daily.   metFORMIN  (GLUCOPHAGE ) 1000 MG tablet Take 1 tablet (1,000 mg total) by mouth 2 (two) times daily.   metoprolol  succinate (TOPROL -XL) 50 MG 24 hr tablet Take 1 tablet (50 mg total) by mouth daily.   rosuvastatin  (CRESTOR ) 20 MG tablet Take 1 tablet (20 mg total) by mouth daily.   tamsulosin  (FLOMAX ) 0.4 MG CAPS capsule TAKE 1 CAPSULE BY MOUTH EVERY DAY   mupirocin  ointment (BACTROBAN ) 2 % Apply 1 application topically 3 (three) times daily. (Patient not taking: Reported on 10/09/2024)   No facility-administered medications prior to visit.    Review of Systems  Constitutional: Negative.   HENT: Negative.    Eyes: Negative.   Respiratory: Negative.  Negative for shortness of breath.  Cardiovascular: Negative.  Negative for chest pain.  Gastrointestinal: Negative.  Negative for abdominal pain, constipation and diarrhea.  Genitourinary: Negative.   Musculoskeletal:  Negative for joint pain and myalgias.  Skin: Negative.   Neurological: Negative.  Negative for dizziness and headaches.  Endo/Heme/Allergies: Negative.   All other systems reviewed and are negative.      Objective:   BP 122/66   Pulse (!) 57   Ht 6' (1.829 m)   Wt 245 lb 6.4 oz (111.3 kg)   SpO2 96%   BMI 33.28 kg/m   Vitals:   10/09/24 1320  BP: 122/66  Pulse: (!) 57  Height: 6' (1.829 m)  Weight: 245 lb 6.4 oz (111.3 kg)  SpO2: 96%  BMI (Calculated): 33.28    Physical Exam Nursing note reviewed.  Constitutional:      Appearance:  Normal appearance. He is normal weight.  HENT:     Head: Normocephalic and atraumatic.     Nose: Nose normal.     Mouth/Throat:     Mouth: Mucous membranes are moist.     Pharynx: Oropharynx is clear.  Eyes:     Extraocular Movements: Extraocular movements intact.     Conjunctiva/sclera: Conjunctivae normal.     Pupils: Pupils are equal, round, and reactive to light.  Cardiovascular:     Rate and Rhythm: Normal rate and regular rhythm.     Pulses: Normal pulses.     Heart sounds: Normal heart sounds.  Pulmonary:     Effort: Pulmonary effort is normal.     Breath sounds: Normal breath sounds.  Abdominal:     General: Abdomen is flat. Bowel sounds are normal.     Palpations: Abdomen is soft.  Musculoskeletal:        General: Normal range of motion.     Cervical back: Normal range of motion.  Skin:    General: Skin is warm and dry.  Neurological:     General: No focal deficit present.     Mental Status: He is alert and oriented to person, place, and time.  Psychiatric:        Mood and Affect: Mood normal.        Behavior: Behavior normal.        Thought Content: Thought content normal.        Judgment: Judgment normal.      No results found for any visits on 10/09/24.  Recent Results (from the past 2160 hours)  Lipid panel     Status: Abnormal   Collection Time: 10/05/24  8:26 AM  Result Value Ref Range   Cholesterol, Total 78 (L) 100 - 199 mg/dL   Triglycerides 863 0 - 149 mg/dL   HDL 26 (L) >60 mg/dL   VLDL Cholesterol Cal 24 5 - 40 mg/dL   LDL Chol Calc (NIH) 28 0 - 99 mg/dL   Chol/HDL Ratio 3.0 0.0 - 5.0 ratio    Comment:                                   T. Chol/HDL Ratio                                             Men  Women  1/2 Avg.Risk  3.4    3.3                                   Avg.Risk  5.0    4.4                                2X Avg.Risk  9.6    7.1                                3X Avg.Risk 23.4   11.0   CMP14+EGFR      Status: Abnormal   Collection Time: 10/05/24  8:26 AM  Result Value Ref Range   Glucose 191 (H) 70 - 99 mg/dL   BUN 25 8 - 27 mg/dL   Creatinine, Ser 8.93 0.76 - 1.27 mg/dL   eGFR 75 >40 fO/fpw/8.26   BUN/Creatinine Ratio 24 10 - 24   Sodium 137 134 - 144 mmol/L   Potassium 4.4 3.5 - 5.2 mmol/L   Chloride 101 96 - 106 mmol/L   CO2 24 20 - 29 mmol/L   Calcium  9.1 8.6 - 10.2 mg/dL   Total Protein 6.6 6.0 - 8.5 g/dL   Albumin 4.3 3.9 - 4.9 g/dL   Globulin, Total 2.3 1.5 - 4.5 g/dL   Bilirubin Total 1.4 (H) 0.0 - 1.2 mg/dL   Alkaline Phosphatase 71 47 - 123 IU/L   AST 17 0 - 40 IU/L   ALT 15 0 - 44 IU/L  TSH     Status: None   Collection Time: 10/05/24  8:26 AM  Result Value Ref Range   TSH 1.510 0.450 - 4.500 uIU/mL  Hemoglobin A1c     Status: Abnormal   Collection Time: 10/05/24  8:26 AM  Result Value Ref Range   Hgb A1c MFr Bld 7.3 (H) 4.8 - 5.6 %    Comment:          Prediabetes: 5.7 - 6.4          Diabetes: >6.4          Glycemic control for adults with diabetes: <7.0    Est. average glucose Bld gHb Est-mCnc 163 mg/dL      Assessment & Plan:  Continue same medications.  Problem List Items Addressed This Visit       Cardiovascular and Mediastinum   Essential hypertension, benign - Primary     Endocrine   Type 2 diabetes mellitus without complications (HCC)     Other   Mixed hyperlipidemia    Return in about 4 months (around 02/09/2025) for fasting lab work prior.   Total time spent: 25 minutes  [employer], NP  10/09/2024   This document may have been prepared by Dragon Voice Recognition software and as such may include unintentional dictation errors.

## 2024-12-03 ENCOUNTER — Other Ambulatory Visit: Payer: Self-pay | Admitting: Cardiology

## 2024-12-03 DIAGNOSIS — E119 Type 2 diabetes mellitus without complications: Secondary | ICD-10-CM

## 2025-02-12 ENCOUNTER — Ambulatory Visit: Admitting: Cardiology
